# Patient Record
Sex: Male | Born: 1955
Health system: Southern US, Community
[De-identification: ages and names within clinical notes are randomized; demographics above are authoritative.]

## PROBLEM LIST (undated history)

## (undated) DIAGNOSIS — J449 Chronic obstructive pulmonary disease, unspecified: Secondary | ICD-10-CM

## (undated) DIAGNOSIS — M545 Low back pain: Secondary | ICD-10-CM

## (undated) DIAGNOSIS — F1010 Alcohol abuse, uncomplicated: Secondary | ICD-10-CM

## (undated) DIAGNOSIS — R0600 Dyspnea, unspecified: Secondary | ICD-10-CM

## (undated) DIAGNOSIS — M1990 Unspecified osteoarthritis, unspecified site: Secondary | ICD-10-CM

## (undated) DIAGNOSIS — IMO0001 Reserved for inherently not codable concepts without codable children: Secondary | ICD-10-CM

## (undated) DIAGNOSIS — Z87898 Personal history of other specified conditions: Secondary | ICD-10-CM

## (undated) DIAGNOSIS — G8929 Other chronic pain: Secondary | ICD-10-CM

## (undated) DIAGNOSIS — C801 Malignant (primary) neoplasm, unspecified: Secondary | ICD-10-CM

## (undated) DIAGNOSIS — F1491 Cocaine use, unspecified, in remission: Secondary | ICD-10-CM

## (undated) DIAGNOSIS — F17200 Nicotine dependence, unspecified, uncomplicated: Secondary | ICD-10-CM

## (undated) HISTORY — DX: Cocaine use, unspecified, in remission: F14.91

## (undated) HISTORY — DX: Low back pain: M54.5

## (undated) HISTORY — PX: ESOPHAGOGASTRODUODENOSCOPY ENDOSCOPY: SHX5814

## (undated) HISTORY — DX: Nicotine dependence, unspecified, uncomplicated: F17.200

## (undated) HISTORY — DX: Alcohol abuse, uncomplicated: F10.10

## (undated) HISTORY — DX: Other chronic pain: G89.29

## (undated) HISTORY — DX: Personal history of other specified conditions: Z87.898

## (undated) HISTORY — DX: Reserved for inherently not codable concepts without codable children: IMO0001

## (undated) HISTORY — PX: COLONOSCOPY: SHX174

---

## 1998-03-20 DIAGNOSIS — M545 Low back pain, unspecified: Secondary | ICD-10-CM

## 1998-03-20 HISTORY — DX: Low back pain, unspecified: M54.50

## 2002-09-11 ENCOUNTER — Emergency Department (HOSPITAL_COMMUNITY): Admission: EM | Admit: 2002-09-11 | Discharge: 2002-09-11 | Payer: Self-pay | Admitting: Emergency Medicine

## 2008-09-25 ENCOUNTER — Emergency Department (HOSPITAL_COMMUNITY): Admission: EM | Admit: 2008-09-25 | Discharge: 2008-09-25 | Payer: Self-pay | Admitting: Emergency Medicine

## 2008-09-30 ENCOUNTER — Emergency Department (HOSPITAL_COMMUNITY): Admission: EM | Admit: 2008-09-30 | Discharge: 2008-09-30 | Payer: Self-pay | Admitting: Emergency Medicine

## 2010-01-19 ENCOUNTER — Inpatient Hospital Stay (HOSPITAL_COMMUNITY): Admission: EM | Admit: 2010-01-19 | Discharge: 2010-01-22 | Payer: Self-pay | Admitting: Psychiatry

## 2010-01-19 ENCOUNTER — Ambulatory Visit: Payer: Self-pay | Admitting: Psychiatry

## 2010-01-19 ENCOUNTER — Emergency Department (HOSPITAL_COMMUNITY): Admission: EM | Admit: 2010-01-19 | Discharge: 2010-01-19 | Payer: Self-pay | Admitting: Emergency Medicine

## 2010-05-14 ENCOUNTER — Emergency Department (HOSPITAL_COMMUNITY): Payer: Managed Care, Other (non HMO)

## 2010-05-14 ENCOUNTER — Emergency Department (HOSPITAL_COMMUNITY)
Admission: EM | Admit: 2010-05-14 | Discharge: 2010-05-14 | Disposition: A | Discharge status: Home / Self Care | Payer: Managed Care, Other (non HMO) | Attending: Emergency Medicine | Admitting: Emergency Medicine

## 2010-05-14 DIAGNOSIS — M25559 Pain in unspecified hip: Secondary | ICD-10-CM | POA: Insufficient documentation

## 2010-05-14 DIAGNOSIS — M76899 Other specified enthesopathies of unspecified lower limb, excluding foot: Secondary | ICD-10-CM | POA: Insufficient documentation

## 2010-05-31 LAB — CBC
Platelets: 236 10*3/uL (ref 150–400)
RDW: 15.9 % — ABNORMAL HIGH (ref 11.5–15.5)
WBC: 5.6 10*3/uL (ref 4.0–10.5)

## 2010-05-31 LAB — TRICYCLICS SCREEN, URINE: TCA Scrn: NOT DETECTED

## 2010-05-31 LAB — DIFFERENTIAL
Basophils Absolute: 0 10*3/uL (ref 0.0–0.1)
Lymphocytes Relative: 38 % (ref 12–46)
Neutro Abs: 3 10*3/uL (ref 1.7–7.7)
Neutrophils Relative %: 54 % (ref 43–77)

## 2010-05-31 LAB — HEPATIC FUNCTION PANEL
ALT: 42 U/L (ref 0–53)
AST: 50 U/L — ABNORMAL HIGH (ref 0–37)
Total Protein: 7 g/dL (ref 6.0–8.3)

## 2010-05-31 LAB — ETHANOL: Alcohol, Ethyl (B): 186 mg/dL — ABNORMAL HIGH (ref 0–10)

## 2010-05-31 LAB — BASIC METABOLIC PANEL
BUN: 13 mg/dL (ref 6–23)
Calcium: 8.8 mg/dL (ref 8.4–10.5)
Creatinine, Ser: 0.76 mg/dL (ref 0.4–1.5)
GFR calc non Af Amer: >60 mL/min (ref >60)
Potassium: 3.7 mEq/L (ref 3.5–5.1)

## 2010-05-31 LAB — RAPID URINE DRUG SCREEN, HOSP PERFORMED: Benzodiazepines: NOT DETECTED

## 2010-10-16 ENCOUNTER — Inpatient Hospital Stay (INDEPENDENT_AMBULATORY_CARE_PROVIDER_SITE_OTHER)
Admission: RE | Admit: 2010-10-16 | Discharge: 2010-10-16 | Disposition: A | Discharge status: Home / Self Care | Payer: Managed Care, Other (non HMO) | Source: Ambulatory Visit | Attending: Family Medicine | Admitting: Family Medicine

## 2010-10-16 DIAGNOSIS — L259 Unspecified contact dermatitis, unspecified cause: Secondary | ICD-10-CM

## 2012-10-24 ENCOUNTER — Ambulatory Visit: Payer: Self-pay

## 2012-10-24 ENCOUNTER — Encounter: Payer: Self-pay | Admitting: Internal Medicine

## 2012-10-24 ENCOUNTER — Ambulatory Visit (INDEPENDENT_AMBULATORY_CARE_PROVIDER_SITE_OTHER): Payer: Self-pay | Admitting: Internal Medicine

## 2012-10-24 VITALS — BP 124/84 | HR 95 | Temp 97.0°F | Ht 74.0 in | Wt 157.9 lb

## 2012-10-24 DIAGNOSIS — F1994 Other psychoactive substance use, unspecified with psychoactive substance-induced mood disorder: Secondary | ICD-10-CM

## 2012-10-24 DIAGNOSIS — M549 Dorsalgia, unspecified: Secondary | ICD-10-CM

## 2012-10-24 DIAGNOSIS — F1010 Alcohol abuse, uncomplicated: Secondary | ICD-10-CM | POA: Insufficient documentation

## 2012-10-24 DIAGNOSIS — F1411 Cocaine abuse, in remission: Secondary | ICD-10-CM

## 2012-10-24 DIAGNOSIS — M160 Bilateral primary osteoarthritis of hip: Secondary | ICD-10-CM | POA: Insufficient documentation

## 2012-10-24 DIAGNOSIS — G8929 Other chronic pain: Secondary | ICD-10-CM

## 2012-10-24 DIAGNOSIS — F17200 Nicotine dependence, unspecified, uncomplicated: Secondary | ICD-10-CM

## 2012-10-24 DIAGNOSIS — Z0000 Encounter for general adult medical examination without abnormal findings: Secondary | ICD-10-CM | POA: Insufficient documentation

## 2012-10-24 DIAGNOSIS — Z720 Tobacco use: Secondary | ICD-10-CM | POA: Insufficient documentation

## 2012-10-24 LAB — COMPLETE METABOLIC PANEL WITH GFR
AST: 54 U/L — ABNORMAL HIGH (ref 0–37)
Alkaline Phosphatase: 90 U/L (ref 39–117)
GFR, Est Non African American: 73 mL/min
Glucose, Bld: 111 mg/dL — ABNORMAL HIGH (ref 70–99)
Potassium: 4.6 mEq/L (ref 3.5–5.3)
Total Bilirubin: 0.7 mg/dL (ref 0.3–1.2)
Total Protein: 7.9 g/dL (ref 6.0–8.3)

## 2012-10-24 LAB — LIPID PANEL
Cholesterol: 125 mg/dL (ref 0–200)
Total CHOL/HDL Ratio: 2.2 Ratio
Triglycerides: 162 mg/dL — ABNORMAL HIGH (ref <150)
VLDL: 32 mg/dL (ref 0–40)

## 2012-10-24 LAB — CBC WITH DIFFERENTIAL/PLATELET
Basophils Relative: 0 % (ref 0–1)
HCT: 38.3 % — ABNORMAL LOW (ref 39.0–52.0)
Hemoglobin: 12.6 g/dL — ABNORMAL LOW (ref 13.0–17.0)
Lymphocytes Relative: 34 % (ref 12–46)
MCHC: 32.9 g/dL (ref 30.0–36.0)
MCV: 78.8 fL (ref 78.0–100.0)
Monocytes Absolute: 0.6 10*3/uL (ref 0.1–1.0)
Monocytes Relative: 7 % (ref 3–12)
Neutro Abs: 5.7 10*3/uL (ref 1.7–7.7)

## 2012-10-24 NOTE — Patient Instructions (Addendum)
I'll order some lab works including cholesterol, liver function tests, PSA, and the CBC. I will contact to assist her with the results on Friday or Monday. Please stop smoking. Please stop alcohol. I will get records from Dr. Farris Has. Her get your colonoscopy results from Dr. Berneice Heinrich.    Treatment Goals:  Goals (1 Years of Data) as of 10/24/12         As of Today     Lifestyle    . Quit smoking / using tobacco  No      Progress Toward Treatment Goals:  Treatment Goal 10/24/2012  Stop smoking smoking the same amount    Self Care Goals & Plans:  Self Care Goal 10/24/2012  Manage my medications take my medicines as prescribed  Stop smoking call [facility] (1-800-QUIT-NOW)       Care Management & Community Referrals:          Smoking Cessation Quitting smoking is important to your health and has many advantages. However, it is not always easy to quit since nicotine is a very addictive drug. Often times, people try 3 times or more before being able to quit. This document explains the best ways for you to prepare to quit smoking. Quitting takes hard work and a lot of effort, but you can do it. ADVANTAGES OF QUITTING SMOKING  You will live longer, feel better, and live better.  Your body will feel the impact of quitting smoking almost immediately.  Within 20 minutes, blood pressure decreases. Your pulse returns to its normal level.  After 8 hours, carbon monoxide levels in the blood return to normal. Your oxygen level increases.  After 24 hours, the chance of having a heart attack starts to decrease. Your breath, hair, and body stop smelling like smoke.  After 48 hours, damaged nerve endings begin to recover. Your sense of taste and smell improve.  After 72 hours, the body is virtually free of nicotine. Your bronchial tubes relax and breathing becomes easier.  After 2 to 12 weeks, lungs can hold more air. Exercise becomes easier and circulation improves.  The risk of  having a heart attack, stroke, cancer, or lung disease is greatly reduced.  After 1 year, the risk of coronary heart disease is cut in half.  After 5 years, the risk of stroke falls to the same as a nonsmoker.  After 10 years, the risk of lung cancer is cut in half and the risk of other cancers decreases significantly.  After 15 years, the risk of coronary heart disease drops, usually to the level of a nonsmoker.  If you are pregnant, quitting smoking will improve your chances of having a healthy baby.  The people you live with, especially any children, will be healthier.  You will have extra money to spend on things other than cigarettes. QUESTIONS TO THINK ABOUT BEFORE ATTEMPTING TO QUIT You may want to talk about your answers with your caregiver.  Why do you want to quit?  If you tried to quit in the past, what helped and what did not?  What will be the most difficult situations for you after you quit? How will you plan to handle them?  Who can help you through the tough times? Your family? Friends? A caregiver?  What pleasures do you get from smoking? What ways can you still get pleasure if you quit? Here are some questions to ask your caregiver:  How can you help me to be successful at quitting?  What medicine do  you think would be best for me and how should I take it?  What should I do if I need more help?  What is smoking withdrawal like? How can I get information on withdrawal? GET READY  Set a quit date.  Change your environment by getting rid of all cigarettes, ashtrays, matches, and lighters in your home, car, or work. Do not let people smoke in your home.  Review your past attempts to quit. Think about what worked and what did not. GET SUPPORT AND ENCOURAGEMENT You have a better chance of being successful if you have help. You can get support in many ways.  Tell your family, friends, and co-workers that you are going to quit and need their support. Ask them  not to smoke around you.  Get individual, group, or telephone counseling and support. Programs are available at [employer] and health centers. Call your local health department for information about programs in your area.  Spiritual beliefs and practices may help some smokers quit.  Download a "quit meter" on your computer to keep track of quit statistics, such as how long you have gone without smoking, cigarettes not smoked, and money saved.  Get a self-help book about quitting smoking and staying off of tobacco. LEARN NEW SKILLS AND BEHAVIORS  Distract yourself from urges to smoke. Talk to someone, go for a walk, or occupy your time with a task.  Change your normal routine. Take a different route to work. Drink tea instead of coffee. Eat breakfast in a different place.  Reduce your stress. Take a hot bath, exercise, or read a book.  Plan something enjoyable to do every day. Reward yourself for not smoking.  Explore interactive web-based programs that specialize in helping you quit. GET MEDICINE AND USE IT CORRECTLY Medicines can help you stop smoking and decrease the urge to smoke. Combining medicine with the above behavioral methods and support can greatly increase your chances of successfully quitting smoking.  Nicotine replacement therapy helps deliver nicotine to your body without the negative effects and risks of smoking. Nicotine replacement therapy includes nicotine gum, lozenges, inhalers, nasal sprays, and skin patches. Some may be available over-the-counter and others require a prescription.  Antidepressant medicine helps people abstain from smoking, but how this works is unknown. This medicine is available by prescription.  Nicotinic receptor partial agonist medicine simulates the effect of nicotine in your brain. This medicine is available by prescription. Ask your caregiver for advice about which medicines to use and how to use them based on your health history. Your  caregiver will tell you what side effects to look out for if you choose to be on a medicine or therapy. Carefully read the information on the package. Do not use any other product containing nicotine while using a nicotine replacement product.  RELAPSE OR DIFFICULT SITUATIONS Most relapses occur within the first 3 months after quitting. Do not be discouraged if you start smoking again. Remember, most people try several times before finally quitting. You may have symptoms of withdrawal because your body is used to nicotine. You may crave cigarettes, be irritable, feel very hungry, cough often, get headaches, or have difficulty concentrating. The withdrawal symptoms are only temporary. They are strongest when you first quit, but they will go away within 10 14 days. To reduce the chances of relapse, try to:  Avoid drinking alcohol. Drinking lowers your chances of successfully quitting.  Reduce the amount of caffeine you consume. Once you quit smoking, the amount  of caffeine in your body increases and can give you symptoms, such as a rapid heartbeat, sweating, and anxiety.  Avoid smokers because they can make you want to smoke.  Do not let weight gain distract you. Many smokers will gain weight when they quit, usually less than 10 pounds. Eat a healthy diet and stay active. You can always lose the weight gained after you quit.  Find ways to improve your mood other than smoking. FOR MORE INFORMATION  www.smokefree.gov  Document Released: 02/28/2001 Document Revised: 09/05/2011 Document Reviewed: 06/15/2011 [facility] Patient Information 2014 [place], [place]. Please followup with me in 2 weeks.    Alcohol Problems Most adults who drink alcohol drink in moderation (not a lot) are at low risk for developing problems related to their drinking. However, all drinkers, including low-risk drinkers, should know about the health risks connected with drinking alcohol. RECOMMENDATIONS FOR LOW-RISK DRINKING   Drink in moderation. Moderate drinking is defined as follows:   Men - no more than 2 drinks per day.  Nonpregnant women - no more than 1 drink per day.  Over age 97 - no more than 1 drink per day. A standard drink is 12 grams of pure alcohol, which is equal to a 12 ounce bottle of beer or wine cooler, a 5 ounce glass of wine, or 1.5 ounces of distilled spirits (such as whiskey, brandy, vodka, or rum).  ABSTAIN FROM (DO NOT DRINK) ALCOHOL:  When pregnant or considering pregnancy.  When taking a medication that interacts with alcohol.  If you are alcohol dependent.  A medical condition that prohibits drinking alcohol (such as ulcer, liver disease, or heart disease). DISCUSS WITH YOUR CAREGIVER:  If you are at risk for coronary heart disease, discuss the potential benefits and risks of alcohol use: Light to moderate drinking is associated with lower rates of coronary heart disease in certain populations (for example, men over age 34 and postmenopausal women). Infrequent or nondrinkers are advised not to begin light to moderate drinking to reduce the risk of coronary heart disease so as to avoid creating an alcohol-related problem. Similar protective effects can likely be gained through proper diet and exercise.  Women and the elderly have smaller amounts of body water than men. As a result women and the elderly achieve a higher blood alcohol concentration after drinking the same amount of alcohol.  Exposing a fetus to alcohol can cause a broad range of birth defects referred to as Fetal Alcohol Syndrome (FAS) or Alcohol-Related Birth Defects (ARBD). Although FAS/ARBD is connected with excessive alcohol consumption during pregnancy, studies also have reported neurobehavioral problems in infants born to mothers reporting drinking an average of 1 drink per day during pregnancy.  Heavier drinking (the consumption of more than 4 drinks per occasion by men and more than 3 drinks per occasion by  women) impairs learning (cognitive) and psychomotor functions and increases the risk of alcohol-related problems, including accidents and injuries. CAGE QUESTIONS:   Have you ever felt that you should Cut down on your drinking?  Have people Annoyed you by criticizing your drinking?  Have you ever felt bad or Guilty about your drinking?  Have you ever had a drink first thing in the morning to steady your nerves or get rid of a hangover (Eye opener)? If you answered positively to any of these questions: You may be at risk for alcohol-related problems if alcohol consumption is:   Men: Greater than 14 drinks per week or more than 4 drinks per occasion.  Women: Greater than 7 drinks per week or more than 3 drinks per occasion. Do you or your family have a medical history of alcohol-related problems, such as:  Blackouts.  Sexual dysfunction.  Depression.  Trauma.  Liver dysfunction.  Sleep disorders.  Hypertension.  Chronic abdominal pain.  Has your drinking ever caused you problems, such as problems with your family, problems with your work (or school) performance, or accidents/injuries?  Do you have a compulsion to drink or a preoccupation with drinking?  Do you have poor control or are you unable to stop drinking once you have started?  Do you have to drink to avoid withdrawal symptoms?  Do you have problems with withdrawal such as tremors, nausea, sweats, or mood disturbances?  Does it take more alcohol than in the past to get you high?  Do you feel a strong urge to drink?  Do you change your plans so that you can have a drink?  Do you ever drink in the morning to relieve the shakes or a hangover? If you have answered a number of the previous questions positively, it may be time for you to talk to your caregivers, family, and friends and see if they think you have a problem. Alcoholism is a chemical dependency that keeps getting worse and will eventually destroy your  health and relationships. Many alcoholics end up dead, impoverished, or in prison. This is often the end result of all chemical dependency.  Do not be discouraged if you are not ready to take action immediately.  Decisions to change behavior often involve up and down desires to change and feeling like you cannot decide.  Try to think more seriously about your drinking behavior.  Think of the reasons to quit. WHERE TO GO FOR ADDITIONAL INFORMATION   The National Institute on Alcohol Abuse and Alcoholism (NIAAA) BasicStudents.dk  [employer] on Alcoholism and Drug Dependence (NCADD) www.ncadd.org  American Society of Addiction Medicine (ASAM) RoyalDiary.gl  Document Released: 03/06/2005 Document Revised: 05/29/2011 Document Reviewed: 10/23/2007 [facility] Patient Information 2014 [place], [place].

## 2012-10-24 NOTE — Assessment & Plan Note (Signed)
  Assessment: Progress toward smoking cessation:  smoking the same amount Barriers to progress toward smoking cessation:  lack of understanding of harms of tobacco Comments:   Plan: Instruction/counseling given:  I counseled patient on the dangers of tobacco use, advised patient to stop smoking, and reviewed strategies to maximize success. Educational resources provided:    Self management tools provided:  smoking cessation plan (STAR Quit Plan) Medications to assist with smoking cessation:  None Patient agreed to the following self-care plans for smoking cessation: call [facility] (1-800-QUIT-NOW)  Other plans: will continue to address this on next visit.

## 2012-10-24 NOTE — Assessment & Plan Note (Signed)
Counseled the patient about alcohol cessation. Ordered a CBC and CMP.

## 2012-10-24 NOTE — Assessment & Plan Note (Signed)
I counseled the patient about avoidance of cocaine abuse.

## 2012-10-24 NOTE — Progress Notes (Signed)
Patient ID: Robert Bray, male   DOB: [birth year], 57 y.o.   MRN: [id number]          HPI: [name] is a 57 y.o. with past medical history of chronic back pain, polysubstance abuse including cocaine, marijuana, and alcohol presents to the clinic to establish care.   The patient comes in with his sister who is also his healthcare power of attorney. The main concern is the ongoing chronic low back pain.   Patient reports that he has been disabled over the last 3 years from chronic back pain, which he attributes to his previous job that involved lifting heavy objects. He was evaluated by orthopedic surgery some time ago and he reports that he used get steroid injections in his hip joints until he ran out of his insurance. He uses cane for last one year.  He describes his pain as constant, about 9/10, shooting down his legs, it improves to about 4/10 with Tylenol. He denies numbness, tingling, or weakness in his legs. He is unsure about the dose of Tylenol that he uses. He reports that he takes 2 pills about twice in a week. The patient is unable to enjoy activities like fishing and also expresses pain when he sits for a long time to watch TV.   The patient lives alone after the death of his wife about 5 years ago. He admits to a history of alcohol abuse, whereby he takes two 40 ounces cans of beers twice a week. However, he denies recent cocaine, marijuana, and other drugs of abuse. Of note, patient has a UDS that was positive for cocaine in 2011. During that time, the patient had an episode of a psych admission with suicide ideations without a plan. He spent 5 days in the hospital.   His sister, who comes with him to the clinic is very motivated to help. The patient. Followup with his clinic appointments and keeping good health.    Past Medical History  Diagnosis Date  . Chronic low back pain 2000    Reports that he has been treated by orthopedic surgeon, but was unable to  continue to to lack of insurance. He has had steroid injections in the hip. He attributes his back pain to his job, which involved lifting heavy objects.  . History of cocaine use     UDS was positive for cocaine in 2011.  Marland Kitchen Smoking   . Alcohol abuse    Current Outpatient Prescriptions  Medication Sig Dispense Refill  . acetaminophen (TYLENOL) 325 MG tablet Take 650 mg by mouth every 6 (six) hours as needed for pain.       No current facility-administered medications for this visit.   Family History  Problem Relation Age of Onset  . Hypertension Mother   . Hypertension Sister   . Kidney failure Mother     transplant in adult age. Unclear cause   . Breast cancer Sister   . Brain cancer Brother   . Breast cancer Sister     In remission    History   Social History  . Marital Status: Widowed    Spouse Name: N/A    Number of Children: N/A  . Years of Education: N/A   Social History Main Topics  . Smoking status: Current Every Day Smoker -- 0.50 packs/day for 15 years  . Smokeless tobacco: None  . Alcohol Use: Yes     Comment: Beer sometimes.  . Drug Use: None  . Sexually  Active: None   Other Topics Concern  . None   Social History Narrative  . None    Review of Systems: Constitutional: Denies fever, chills, diaphoresis, appetite change and fatigue.  Respiratory: Denies SOB, DOE, cough, chest tightness, and wheezing.  Cardiovascular: No chest pain, palpitations and leg swelling.  Gastrointestinal: No abdominal pain, nausea, vomiting, bloody stools Genitourinary: No dysuria, frequency, hematuria, or flank pain.   Objective:  Physical Exam: Filed Vitals:   10/24/12 0906  BP: 124/84  Pulse: 95  Temp: 97 F (36.1 C)  TempSrc: Oral  Height: 6\' 2"  (1.88 m)  Weight: 157 lb 14.4 oz (71.623 kg)  SpO2: 97%   General: Well nourished. No acute distress. His sister in exam room Lungs: CTA bilaterally. Heart: RRR; no extra sounds or murmurs  Abdomen: Non-distended,  normal BS, soft, nontender; no hepatosplenomegaly Rectal exam: Normal, without prostate enlargement. Musculoskeletal: Patient has some difficulties getting to the couch. Internal and external rotation of both hip joints elicit mild tenderness. Straight leg raising sign is negative on both legs.  Examination of his back is benign.  Extremities: No pedal edema. Good strength bilaterally. Neurologic: Alert and oriented x3. No obvious neurologic deficits.  Assessment & Plan:  I have discussed my assessment and plan  with Dr. Meredith Pel  as detailed under problem based charting.

## 2012-10-24 NOTE — Assessment & Plan Note (Signed)
The etiology of this chronic back pain is unclear, but could very well be related to his previous job. Fortunately, is getting good relief from Tylenol. I have encouraged him to continue using Tylenol as needed for pain. He will bring his pills on the next visit. I will get records from his orthopedic office. I chose to defer imaging until I review medical record. I will see him 2 weeks from now.

## 2012-10-24 NOTE — Assessment & Plan Note (Addendum)
The patient's sister was very concerned about his screening and health maintenance. Reportedly, he had a normal colonoscopy 3 years ago by Dr. Berneice Heinrich. Will get report for this. I have ordered a PSA, even though his digital rectal exam was normal. I have also ordered a lipid panel

## 2012-10-25 ENCOUNTER — Telehealth: Payer: Self-pay | Admitting: Internal Medicine

## 2012-10-25 NOTE — Progress Notes (Signed)
Case discussed with Dr. Kazibwe soon after the resident saw the patient.  We reviewed the resident's history and exam and pertinent patient test results.  I agree with the assessment, diagnosis, and plan of care documented in the resident's note. 

## 2012-10-25 NOTE — Telephone Encounter (Signed)
I called Mr. Lundberg' sister and informed her about the results of the CMP, CBC, PSA, and lipid panel.

## 2012-11-07 ENCOUNTER — Encounter: Payer: Self-pay | Admitting: Internal Medicine

## 2012-11-07 ENCOUNTER — Ambulatory Visit (INDEPENDENT_AMBULATORY_CARE_PROVIDER_SITE_OTHER): Payer: No Typology Code available for payment source | Admitting: Internal Medicine

## 2012-11-07 VITALS — BP 124/82 | HR 84 | Temp 96.5°F | Ht 74.0 in | Wt 160.2 lb

## 2012-11-07 DIAGNOSIS — Z0000 Encounter for general adult medical examination without abnormal findings: Secondary | ICD-10-CM

## 2012-11-07 DIAGNOSIS — M549 Dorsalgia, unspecified: Secondary | ICD-10-CM

## 2012-11-07 DIAGNOSIS — G8929 Other chronic pain: Secondary | ICD-10-CM

## 2012-11-07 DIAGNOSIS — M25559 Pain in unspecified hip: Secondary | ICD-10-CM

## 2012-11-07 NOTE — Assessment & Plan Note (Signed)
Discussed with the patient that we will consider doing a hip xray but we would prefer to have his records from the orthopedics first. His pain is currently controlled with Advil.  Plan  - obtain records from Dr Farris Has (ortho) - Consider imaging with xray followed by Ct scan as there is a possibility of AVN - to continue with Advil for now  - to follow up in one month

## 2012-11-07 NOTE — Progress Notes (Signed)
Subjective:   HPI: [name] is a 57 y.o. gentleman with past medical history of chronic low back pain, and right hip pain, history of cocaine abuse, cigarette smoking, and alcohol abuse, presents to the clinic for followup visit.  Patient continues to report his right hip pain and chronic back pain have continued to bother him. However, report some relief with Advil 2 pills when necessary. He is able to do activities like mowing the grass and fishing after taking 2 pills of Advil. We have been unable to obtain her records from his previous orthopedic surgeon. He denies interim symptoms since last visit 2 weeks.   Kindly see the A&P for the status of the pt's chronic medical problems.   Past Medical History  Diagnosis Date  . Chronic low back pain 2000    Reports that he has been treated by orthopedic surgeon, but was unable to continue to to lack of insurance. He has had steroid injections in the hip. He attributes his back pain to his job, which involved lifting heavy objects.  . History of cocaine use     UDS was positive for cocaine in 2011.  Marland Kitchen Smoking   . Alcohol abuse    Current Outpatient Prescriptions  Medication Sig Dispense Refill  . naproxen sodium (ANAPROX) 220 MG tablet Take 220 mg by mouth 2 (two) times daily with a meal.       No current facility-administered medications for this visit.   Family History  Problem Relation Age of Onset  . Hypertension Mother   . Hypertension Sister   . Kidney failure Mother     transplant in adult age. Unclear cause   . Breast cancer Sister   . Brain cancer Brother   . Breast cancer Sister     In remission    History   Social History  . Marital Status: Widowed    Spouse Name: N/A    Number of Children: N/A  . Years of Education: N/A   Social History Main Topics  . Smoking status: Current Every Day Smoker -- 0.30 packs/day for 15 years  . Smokeless tobacco: None  . Alcohol Use: Yes     Comment: Beer  sometimes.  . Drug Use: None  . Sexual Activity: None   Other Topics Concern  . None   Social History Narrative  . None   Review of Systems: Constitutional: Denies fever, chills, diaphoresis, appetite change and fatigue.  Respiratory: Denies SOB, DOE, cough, chest tightness, and wheezing.  Cardiovascular: No chest pain, palpitations and leg swelling.  Gastrointestinal: No abdominal pain, nausea, vomiting, bloody stools Genitourinary: No dysuria, frequency, hematuria, or flank pain.   Objective:  Physical Exam: Filed Vitals:   11/07/12 0950  BP: 124/82  Pulse: 84  Temp: 96.5 F (35.8 C)  TempSrc: Oral  Height: 6\' 2"  (1.88 m)  Weight: 160 lb 3.2 oz (72.666 kg)  SpO2: 97%   General: Well nourished. No acute distress. His sister in exam room  Lungs: CTA bilaterally.  Heart: RRR; no extra sounds or murmurs  Abdomen: Non-distended, normal BS, soft, nontender; no hepatosplenomegaly  Rectal exam: Normal, without prostate enlargement.  Musculoskeletal: Patient has some difficulties getting to the couch. Internal and external rotation of both hip joints elicit mild tenderness. Straight leg raising sign is negative on both legs.  Examination of his back is benign.  Extremities: No pedal edema. Good strength bilaterally.  Neurologic: Alert and oriented x3. No obvious neurologic deficits.  Assessment &  Plan:  I have discussed my assessment and plan  with Dr. Josem Kaufmann as detailed under problem based charting.

## 2012-11-07 NOTE — Patient Instructions (Addendum)
General Instructions: We will continue to try get records from Dr Remigio Eisenmenger  Please be sure to get your orange card application  Please follow up within one month Please stop alcohol and cigarette smoking    Treatment Goals:  Goals (1 Years of Data) as of 11/07/12         10/24/12     Lifestyle    . Quit smoking / using tobacco  No      Progress Toward Treatment Goals:  Treatment Goal 11/07/2012  Stop smoking smoking the same amount    Self Care Goals & Plans:  Self Care Goal 11/07/2012  Manage my medications take my medicines as prescribed  Stop smoking call [facility] (1-800-QUIT-NOW)       Care Management & Community Referrals:

## 2012-11-07 NOTE — Assessment & Plan Note (Signed)
Pain controlled with Advil. He will follow up in one month. We will get medical records from ortho

## 2012-11-10 NOTE — Progress Notes (Signed)
Case discussed with Dr. Zada Girt immediately after the visit. We reviewed the resident's history and exam and pertinent patient test results. I agree with the assessment, diagnosis and plan of care documented in the resident's note.  AVN remains in the differential diagnosis and if the hip film is unremarkable it must be considered.  Further evaluation could include a CT scan of the hip to further assess.

## 2012-12-01 ENCOUNTER — Encounter: Payer: Self-pay | Admitting: Internal Medicine

## 2012-12-23 ENCOUNTER — Ambulatory Visit (INDEPENDENT_AMBULATORY_CARE_PROVIDER_SITE_OTHER): Payer: No Typology Code available for payment source | Admitting: Internal Medicine

## 2012-12-23 ENCOUNTER — Encounter: Payer: Self-pay | Admitting: Internal Medicine

## 2012-12-23 VITALS — BP 115/68 | HR 91 | Temp 98.5°F | Ht 74.0 in | Wt 155.5 lb

## 2012-12-23 DIAGNOSIS — G8929 Other chronic pain: Secondary | ICD-10-CM

## 2012-12-23 DIAGNOSIS — Z720 Tobacco use: Secondary | ICD-10-CM

## 2012-12-23 DIAGNOSIS — M549 Dorsalgia, unspecified: Secondary | ICD-10-CM

## 2012-12-23 DIAGNOSIS — F17200 Nicotine dependence, unspecified, uncomplicated: Secondary | ICD-10-CM

## 2012-12-23 DIAGNOSIS — M25559 Pain in unspecified hip: Secondary | ICD-10-CM

## 2012-12-23 LAB — BASIC METABOLIC PANEL WITH GFR
BUN: 12 mg/dL (ref 6–23)
Calcium: 9.8 mg/dL (ref 8.4–10.5)
Creat: 1.3 mg/dL (ref 0.50–1.35)
GFR, Est African American: 70 mL/min
GFR, Est Non African American: 61 mL/min

## 2012-12-23 MED ORDER — NAPROXEN 500 MG PO TABS: 500.0000 mg | ORAL_TABLET | Freq: Two times a day (BID) | ORAL | Status: DC

## 2012-12-23 MED ORDER — GABAPENTIN 300 MG PO CAPS: 300.0000 mg | ORAL_CAPSULE | Freq: Three times a day (TID) | ORAL | Status: DC

## 2012-12-23 NOTE — Assessment & Plan Note (Signed)
As noted in overview, he has extensive imaging and various treatments of his hips and back in from Dr Blenda Bridegroom office between 2012 to 2013. There is a possible component of neuropathic pain as well. Family inquires about benefit of gabapentin. He might benefit from this medication.  Plan  - Gabapentin up to 300 mg tid. If cost is prohibitive, we can try Amitriptylline  - A referral to physical therapy -Continue with naproxen 500 mg twice a day when necessary - follow up in one month at which time we can consider referral to pain clinic

## 2012-12-23 NOTE — Assessment & Plan Note (Signed)
Pt not ready to quit. I assured him that we can help him when he is ready.

## 2012-12-23 NOTE — Progress Notes (Signed)
Subjective:   HPI: [name] is a 57 y.o. gentleman with past medical history of chronic low back pain, and right hip pain, history of cocaine abuse, cigarette smoking, and alcohol abuse, presents to the clinic for followup visit. I last saw him on 11/07/2012.  Patient continues to report his right hip pain and chronic back pain have continued to bother him and continues to use Advil 2 pills when necessary.  Sometimes the pain wakes him up from sleep, but again, he reports relief with Advil. The pain tends to increased on cold days.   He also reports to me that over the last 1 week, he has developed muscle cramps involving his legs. This is the first time he has experienced these symptoms. He attributes to his muscle cramps to the cold weather. He has not tried any treatment for these muscle cramps. His labs in 10/2012 including K were largely unremarkable. No new recent medications. However, the patient also reports burning sensations on the lateral side of his thighs bilaterally, without tingling, or numbness. He has significant difficulties walking. He required a wheelchair from the parking lot to the clinic.  Kindly see the A&P for the status of the pt's chronic medical problems.   Past Medical History  Diagnosis Date  . Chronic low back pain 2000    Reports that he has been treated by orthopedic surgeon, but was unable to continue to to lack of insurance. He has had steroid injections in the hip. He attributes his back pain to his job, which involved lifting heavy objects.  . History of cocaine use     UDS was positive for cocaine in 2011.  Marland Kitchen Smoking   . Alcohol abuse    Current Outpatient Prescriptions  Medication Sig Dispense Refill  . gabapentin (NEURONTIN) 300 MG capsule Take 1 capsule (300 mg total) by mouth 3 (three) times daily.  90 capsule  2  . naproxen (NAPROSYN) 500 MG tablet Take 1 tablet (500 mg total) by mouth 2 (two) times daily with a meal.  60 tablet  2    No current facility-administered medications for this visit.   Family History  Problem Relation Age of Onset  . Hypertension Mother   . Hypertension Sister   . Kidney failure Mother     transplant in adult age. Unclear cause   . Breast cancer Sister   . Brain cancer Brother   . Breast cancer Sister     In remission    History   Social History  . Marital Status: Widowed    Spouse Name: N/A    Number of Children: N/A  . Years of Education: N/A   Social History Main Topics  . Smoking status: Current Every Day Smoker -- 0.30 packs/day for 15 years  . Smokeless tobacco: None  . Alcohol Use: Yes     Comment: Beer sometimes.  . Drug Use: None  . Sexual Activity: None   Other Topics Concern  . None   Social History Narrative  . None   Review of Systems: Constitutional: Denies fever, chills, diaphoresis, appetite change and fatigue.  Respiratory: Denies SOB, DOE, cough, chest tightness, and wheezing.  Cardiovascular: No chest pain, palpitations and leg swelling.  Gastrointestinal: No abdominal pain, nausea, vomiting, bloody stools Genitourinary: No dysuria, frequency, hematuria, or flank pain.   Objective:  Physical Exam: Filed Vitals:   12/23/12 1322  BP: 115/68  Pulse: 91  Temp: 98.5 F (36.9 C)  TempSrc: Oral  Height:  6\' 2"  (1.88 m)  Weight: 155 lb 8 oz (70.534 kg)  SpO2: 98%   General: Well nourished. No acute distress. His sister in exam room. antalgic gait.  Lungs: CTA bilaterally.  Heart: RRR; no extra sounds or murmurs  Abdomen: Non-distended, normal BS, soft, nontender; no hepatosplenomegaly  Rectal exam: Normal, without prostate enlargement.  Musculoskeletal: Patient has some difficulties getting to the couch. Internal and external rotation of both hip joints elicit mild tenderness. Straight leg raising sign is negative on both legs. Examination of reveals tenderness on deep palpation of the lumbar region. No increased tensity of the paraspinal  muscles.  Extremities: No pedal edema. Good strength bilaterally.  Neurologic: Alert and oriented x3. No obvious neurologic deficits.  Assessment & Plan:  I have discussed my assessment and plan with Dr. Josem Bray as detailed under problem based charting.

## 2012-12-23 NOTE — Assessment & Plan Note (Addendum)
See note under chronic back pain. Due to his described cramps, I will order BMP and Mg level.

## 2012-12-23 NOTE — Patient Instructions (Signed)
Please start gabapentin at with 300 mg daily for one day, then 300 mg twice daily for one day then 300 mg 3 times daily. If cost is prohibitive, we can try Amitriptyline  I will send you over to physical therapy  I will order some labs today  Please continue to take Naproxen 500 mg twice daily as needed for pain  I will see you again in 1 month

## 2012-12-23 NOTE — Progress Notes (Signed)
Case discussed with Dr. Kazibwe at the time of the visit.  We reviewed the resident's history and exam and pertinent patient test results.  I agree with the assessment, diagnosis and plan of care documented in the resident's note. 

## 2013-01-15 ENCOUNTER — Ambulatory Visit: Payer: No Typology Code available for payment source | Attending: Internal Medicine

## 2013-01-15 DIAGNOSIS — M1610 Unilateral primary osteoarthritis, unspecified hip: Secondary | ICD-10-CM | POA: Insufficient documentation

## 2013-01-15 DIAGNOSIS — M2560 Stiffness of unspecified joint, not elsewhere classified: Secondary | ICD-10-CM | POA: Insufficient documentation

## 2013-01-15 DIAGNOSIS — R293 Abnormal posture: Secondary | ICD-10-CM | POA: Insufficient documentation

## 2013-01-15 DIAGNOSIS — IMO0001 Reserved for inherently not codable concepts without codable children: Secondary | ICD-10-CM | POA: Insufficient documentation

## 2013-01-15 DIAGNOSIS — M169 Osteoarthritis of hip, unspecified: Secondary | ICD-10-CM | POA: Insufficient documentation

## 2013-01-15 DIAGNOSIS — M2550 Pain in unspecified joint: Secondary | ICD-10-CM | POA: Insufficient documentation

## 2013-01-15 DIAGNOSIS — R262 Difficulty in walking, not elsewhere classified: Secondary | ICD-10-CM | POA: Insufficient documentation

## 2013-01-20 ENCOUNTER — Ambulatory Visit: Payer: No Typology Code available for payment source | Attending: Internal Medicine

## 2013-01-20 DIAGNOSIS — M169 Osteoarthritis of hip, unspecified: Secondary | ICD-10-CM | POA: Insufficient documentation

## 2013-01-20 DIAGNOSIS — IMO0001 Reserved for inherently not codable concepts without codable children: Secondary | ICD-10-CM | POA: Insufficient documentation

## 2013-01-20 DIAGNOSIS — R293 Abnormal posture: Secondary | ICD-10-CM | POA: Insufficient documentation

## 2013-01-20 DIAGNOSIS — M1610 Unilateral primary osteoarthritis, unspecified hip: Secondary | ICD-10-CM | POA: Insufficient documentation

## 2013-01-20 DIAGNOSIS — M2560 Stiffness of unspecified joint, not elsewhere classified: Secondary | ICD-10-CM | POA: Insufficient documentation

## 2013-01-20 DIAGNOSIS — M2550 Pain in unspecified joint: Secondary | ICD-10-CM | POA: Insufficient documentation

## 2013-01-20 DIAGNOSIS — R262 Difficulty in walking, not elsewhere classified: Secondary | ICD-10-CM | POA: Insufficient documentation

## 2013-01-22 ENCOUNTER — Encounter: Payer: No Typology Code available for payment source | Admitting: Physical Therapy

## 2013-01-22 ENCOUNTER — Ambulatory Visit (INDEPENDENT_AMBULATORY_CARE_PROVIDER_SITE_OTHER): Payer: No Typology Code available for payment source | Admitting: Internal Medicine

## 2013-01-22 ENCOUNTER — Encounter: Payer: Self-pay | Admitting: Internal Medicine

## 2013-01-22 VITALS — BP 114/70 | HR 112 | Temp 97.4°F | Ht 73.0 in | Wt 155.3 lb

## 2013-01-22 DIAGNOSIS — M25559 Pain in unspecified hip: Secondary | ICD-10-CM

## 2013-01-22 DIAGNOSIS — M549 Dorsalgia, unspecified: Secondary | ICD-10-CM

## 2013-01-22 DIAGNOSIS — Z0000 Encounter for general adult medical examination without abnormal findings: Secondary | ICD-10-CM

## 2013-01-22 DIAGNOSIS — Z720 Tobacco use: Secondary | ICD-10-CM

## 2013-01-22 DIAGNOSIS — F17200 Nicotine dependence, unspecified, uncomplicated: Secondary | ICD-10-CM

## 2013-01-22 DIAGNOSIS — G8929 Other chronic pain: Secondary | ICD-10-CM

## 2013-01-22 DIAGNOSIS — Z23 Encounter for immunization: Secondary | ICD-10-CM

## 2013-01-22 DIAGNOSIS — F1010 Alcohol abuse, uncomplicated: Secondary | ICD-10-CM

## 2013-01-22 MED ORDER — NICOTINE 14 MG/24HR TD PT24: 14.0000 mg | MEDICATED_PATCH | TRANSDERMAL | Status: DC

## 2013-01-22 NOTE — Assessment & Plan Note (Signed)
Chronic low back pain, responding examined for outpatient PT. Encouraged patient to continue with PT and use Tylenol PM one pill as needed at bedtime.

## 2013-01-22 NOTE — Progress Notes (Signed)
Subjective:   HPI: Robert Bray is a 57 y.o. gentleman with past medical history of chronic low back pain, and right hip pain, history of cocaine abuse, cigarette smoking, and alcohol abuse, presents to the clinic for followup visit.  No new complaints today. Last office visit was 12/23/2012 where I referred him to outpatient PT. The response from therapy has been excellent. His low back pain and leg pains have significantly improved. He attends outpatient PT twice a week. He was unable to get Neurontin due to cost. He has been using Tylenol PM when necessary at bedtime. He only needs it a few times a week.  Kindly see the A&P for the status of the pt's chronic medical problems.   Past Medical History  Diagnosis Date  . Chronic low back pain 2000    Reports that he has been treated by orthopedic surgeon, but was unable to continue to to lack of insurance. He has had steroid injections in the hip. He attributes his back pain to his job, which involved lifting heavy objects.  . History of cocaine use     UDS was positive for cocaine in 2011.  Marland Kitchen Smoking   . Alcohol abuse    Current Outpatient Prescriptions  Medication Sig Dispense Refill  . diphenhydramine-acetaminophen (TYLENOL PM) 25-500 MG TABS Take 1-2 tablets by mouth at bedtime as needed.      . nicotine (NICODERM CQ - DOSED IN MG/24 HOURS) 14 mg/24hr patch Place 1 patch (14 mg total) onto the skin daily.  30 patch  0   No current facility-administered medications for this visit.   Family History  Problem Relation Age of Onset  . Hypertension Mother   . Hypertension Sister   . Kidney failure Mother     transplant in adult age. Unclear cause   . Breast cancer Sister   . Brain cancer Brother   . Breast cancer Sister     In remission    History   Social History  . Marital Status: Widowed    Spouse Name: N/A    Number of Children: N/A  . Years of Education: N/A   Social History Main Topics  . Smoking status:  Current Every Day Smoker -- 0.30 packs/day for 15 years  . Smokeless tobacco: None  . Alcohol Use: Yes     Comment: Beer sometimes.  . Drug Use: None  . Sexual Activity: None   Other Topics Concern  . None   Social History Narrative  . None   Review of Systems: Constitutional: Denies fever, chills, diaphoresis, appetite change and fatigue.  Respiratory: Denies SOB, DOE, cough, chest tightness, and wheezing.  Cardiovascular: No chest pain, palpitations and leg swelling.  Gastrointestinal: No abdominal pain, nausea, vomiting, bloody stools Genitourinary: No dysuria, frequency, hematuria, or flank pain.   Objective:  Physical Exam: Filed Vitals:   01/22/13 1025  BP: 114/70  Pulse: 112  Temp: 97.4 F (36.3 C)  TempSrc: Oral  Height: 6\' 1"  (1.854 m)  Weight: 155 lb 4.8 oz (70.444 kg)  SpO2: 99%   General: Well nourished. Appears comfortable. No acute distress. His sister in exam room. antalgic gait.  Lungs: CTA bilaterally.  Heart: RRR; no extra sounds or murmurs  Abdomen: Non-distended, normal BS, soft, nontender; no hepatosplenomegaly  Rectal exam: Normal, without prostate enlargement.  Musculoskeletal: Internal and external rotation of both hip joints elicit no tenderness. Straight leg raising sign is negative on both legs.  No increased tensity of the paraspinal  muscles.  Extremities: No pedal edema. Good strength bilaterally.  Neurologic: Alert and oriented x3. No obvious neurologic deficits.  Assessment & Plan:  I have discussed my assessment and plan with Dr. Dalphine Handing as detailed under problem based charting.

## 2013-01-22 NOTE — Assessment & Plan Note (Signed)
Patient wishes to stop smoking. He is interested in trying nicotine patches. Will prescribe.

## 2013-01-22 NOTE — Assessment & Plan Note (Signed)
Pain responding to PT. Continue with PT

## 2013-01-22 NOTE — Patient Instructions (Signed)
Please continue with physical therapy  Please continue with Tylenol PM for pain Please follow up in 3 month.

## 2013-01-23 ENCOUNTER — Ambulatory Visit: Payer: No Typology Code available for payment source | Admitting: Rehabilitation

## 2013-01-23 NOTE — Progress Notes (Signed)
Case discussed with Dr.Kazibwe at the time of the visit.  We reviewed the resident's history and exam and pertinent patient test results.  I agree with the assessment, diagnosis, and plan of care documented in the resident's note.    

## 2013-01-27 ENCOUNTER — Ambulatory Visit: Payer: No Typology Code available for payment source

## 2013-01-28 ENCOUNTER — Ambulatory Visit: Payer: No Typology Code available for payment source | Admitting: Physical Therapy

## 2013-01-29 ENCOUNTER — Encounter: Payer: No Typology Code available for payment source | Admitting: Internal Medicine

## 2013-01-29 ENCOUNTER — Ambulatory Visit: Payer: No Typology Code available for payment source | Admitting: Rehabilitation

## 2013-02-04 ENCOUNTER — Ambulatory Visit: Payer: No Typology Code available for payment source | Admitting: Rehabilitation

## 2013-02-05 ENCOUNTER — Ambulatory Visit: Payer: No Typology Code available for payment source | Admitting: Physical Therapy

## 2013-02-11 ENCOUNTER — Ambulatory Visit: Payer: No Typology Code available for payment source | Admitting: Rehabilitation

## 2013-02-11 ENCOUNTER — Ambulatory Visit: Payer: No Typology Code available for payment source

## 2013-02-18 ENCOUNTER — Ambulatory Visit: Payer: No Typology Code available for payment source | Attending: Internal Medicine

## 2013-02-18 DIAGNOSIS — M1610 Unilateral primary osteoarthritis, unspecified hip: Secondary | ICD-10-CM | POA: Insufficient documentation

## 2013-02-18 DIAGNOSIS — IMO0001 Reserved for inherently not codable concepts without codable children: Secondary | ICD-10-CM | POA: Insufficient documentation

## 2013-02-18 DIAGNOSIS — M2560 Stiffness of unspecified joint, not elsewhere classified: Secondary | ICD-10-CM | POA: Insufficient documentation

## 2013-02-18 DIAGNOSIS — R262 Difficulty in walking, not elsewhere classified: Secondary | ICD-10-CM | POA: Insufficient documentation

## 2013-02-18 DIAGNOSIS — M169 Osteoarthritis of hip, unspecified: Secondary | ICD-10-CM | POA: Insufficient documentation

## 2013-02-18 DIAGNOSIS — M2550 Pain in unspecified joint: Secondary | ICD-10-CM | POA: Insufficient documentation

## 2013-02-18 DIAGNOSIS — R293 Abnormal posture: Secondary | ICD-10-CM | POA: Insufficient documentation

## 2013-03-10 ENCOUNTER — Ambulatory Visit: Payer: No Typology Code available for payment source

## 2013-04-30 ENCOUNTER — Ambulatory Visit (INDEPENDENT_AMBULATORY_CARE_PROVIDER_SITE_OTHER): Payer: No Typology Code available for payment source | Admitting: Internal Medicine

## 2013-04-30 ENCOUNTER — Encounter: Payer: Self-pay | Admitting: Internal Medicine

## 2013-04-30 VITALS — BP 113/74 | HR 109 | Temp 97.5°F | Ht 73.0 in | Wt 168.5 lb

## 2013-04-30 DIAGNOSIS — M25551 Pain in right hip: Secondary | ICD-10-CM

## 2013-04-30 DIAGNOSIS — M549 Dorsalgia, unspecified: Principal | ICD-10-CM

## 2013-04-30 DIAGNOSIS — I8390 Asymptomatic varicose veins of unspecified lower extremity: Secondary | ICD-10-CM | POA: Insufficient documentation

## 2013-04-30 DIAGNOSIS — G5710 Meralgia paresthetica, unspecified lower limb: Secondary | ICD-10-CM

## 2013-04-30 DIAGNOSIS — L988 Other specified disorders of the skin and subcutaneous tissue: Secondary | ICD-10-CM

## 2013-04-30 DIAGNOSIS — L959 Vasculitis limited to the skin, unspecified: Secondary | ICD-10-CM

## 2013-04-30 DIAGNOSIS — G8929 Other chronic pain: Secondary | ICD-10-CM

## 2013-04-30 DIAGNOSIS — M25559 Pain in unspecified hip: Secondary | ICD-10-CM

## 2013-04-30 DIAGNOSIS — M545 Low back pain, unspecified: Secondary | ICD-10-CM

## 2013-04-30 LAB — CBC WITH DIFFERENTIAL/PLATELET
BASOS ABS: 0 10*3/uL (ref 0.0–0.1)
Basophils Relative: 0 % (ref 0–1)
Eosinophils Absolute: 0.1 10*3/uL (ref 0.0–0.7)
Eosinophils Relative: 2 % (ref 0–5)
HCT: 36.2 % — ABNORMAL LOW (ref 39.0–52.0)
Hemoglobin: 11.7 g/dL — ABNORMAL LOW (ref 13.0–17.0)
LYMPHS PCT: 39 % (ref 12–46)
Lymphs Abs: 2.8 10*3/uL (ref 0.7–4.0)
MCH: 25.1 pg — ABNORMAL LOW (ref 26.0–34.0)
MCHC: 32.3 g/dL (ref 30.0–36.0)
MCV: 77.7 fL — ABNORMAL LOW (ref 78.0–100.0)
Monocytes Absolute: 0.4 10*3/uL (ref 0.1–1.0)
Monocytes Relative: 6 % (ref 3–12)
NEUTROS ABS: 3.7 10*3/uL (ref 1.7–7.7)
Neutrophils Relative %: 53 % (ref 43–77)
Platelets: 208 10*3/uL (ref 150–400)
RBC: 4.66 MIL/uL (ref 4.22–5.81)
RDW: 16.8 % — ABNORMAL HIGH (ref 11.5–15.5)
WBC: 7 10*3/uL (ref 4.0–10.5)

## 2013-04-30 LAB — COMPLETE METABOLIC PANEL WITH GFR
ALBUMIN: 4.9 g/dL (ref 3.5–5.2)
ALT: 39 U/L (ref 0–53)
AST: 52 U/L — ABNORMAL HIGH (ref 0–37)
Alkaline Phosphatase: 87 U/L (ref 39–117)
BILIRUBIN TOTAL: 0.5 mg/dL (ref 0.2–1.2)
BUN: 17 mg/dL (ref 6–23)
CHLORIDE: 105 meq/L (ref 96–112)
CO2: 26 meq/L (ref 19–32)
Calcium: 9.8 mg/dL (ref 8.4–10.5)
Creat: 1.36 mg/dL — ABNORMAL HIGH (ref 0.50–1.35)
GFR, EST AFRICAN AMERICAN: 66 mL/min
GFR, EST NON AFRICAN AMERICAN: 57 mL/min — AB
GLUCOSE: 95 mg/dL (ref 70–99)
POTASSIUM: 5.1 meq/L (ref 3.5–5.3)
SODIUM: 141 meq/L (ref 135–145)
TOTAL PROTEIN: 8 g/dL (ref 6.0–8.3)

## 2013-04-30 LAB — RHEUMATOID FACTOR

## 2013-04-30 MED ORDER — MELOXICAM 7.5 MG PO TABS: 7.5000 mg | ORAL_TABLET | Freq: Every day | ORAL | Status: DC

## 2013-04-30 NOTE — Assessment & Plan Note (Signed)
Patient continues to complain to pain   Plan  - referral to pain clinic  - will add Mobic to his Tylenol PM to see if that helps  - reevaluate in 2 weeks

## 2013-04-30 NOTE — Assessment & Plan Note (Addendum)
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  Cutaneous vasculitis can be associated with multiple diseases and the diagnosis of discharge can be very challenging. Examples of diseases that can be associated with this condition include: cutaneous leukocytoclastic vasculitis/cutaneous leukocytoclastic angiitis, polyarteritis nodosa, nodular vasculitis, granulomatosis with polyangiitis, eosinophilic granulomatosis with polyangiitis among others.  Plan: 1. Labs/imaging: CBC with differential, CMP, urinalysis, hepatitis B, and hepatitis C, ANA, rheumatoid factor, ESR, and CRP, HIV. Discussed with Dr Murlean Caller who also recommended referral for EMG 2. Therapy: Mobic (7.5 mg daily) for back pain can also help with cutaneous vasculitis. I advised the patient to elevate his legs to relieve swelling. 3. Follow up: 2 weeks to review results and see how he responds to trial of NSAIDS. Another option is prednisone if vasculitis is confirmed.

## 2013-04-30 NOTE — Assessment & Plan Note (Addendum)
Assessment: Most likely diagnosis: right Meralgia paraesthetica in view of physical examination findings with loss of sensation in the distribution of the right lateral femoral cutaneous nerve. Most likely this is due to. Chart review indicates 05/25/2010: Lumbar spine MRI : L1 -L2, right lateral recess protrusion and cephalad extension on the right L1 nerve root. L4-L5. The lateral femoral cutaneous nerves originates from L2/L3 which is consistent with the MRI findings as documented from 3 years ago. WUJ:[id number]:other possibilities considered include entrapment of the lateral femoral cutaneous nerve as it passes under the inguinal ligament. But the patient does not have any of the frequent associated conditions for this like obesity, diabetes mellitus of history of tight garments.   Plan: 1. Labs/imaging: EMG studies 2. Therapy: Meralgia paresthetica is a self-limited, benign disease in most patients. If symptoms persist, will try with gabapentin but patient can not afford this at this time.  3. Follow up: 2 weeks

## 2013-04-30 NOTE — Progress Notes (Signed)
Patient ID: Robert Bray, male   DOB: 11-22-1955, 58 y.o.   MRN: [id number]   Subjective:   HPI: [name] is a 58 y.o. gentleman with past medical history of chronic low back pain, and right hip pain, history of cocaine abuse, cigarette smoking, and alcohol abuse, presents to the clinic for followup visit.  Rash on the legs: Patient's main complaint today is itching a rash on his lower extremities which has been present for the last 2 years. This is the first time he is revealing to me this problem. He remembers that the rash started when he went out camping with his family 2 years ago and the next morning he noticed a rash on his feet. He initially ignored it thinking that it would resolve. However, since then, he has continued to have it on and off. It is seems to be aggravated by wearing tight socks.  He reports that sometimes his feet become swollen. He has not used any home remedies to relieve his symptoms. He denies any other areas of rash. He also denies constitutional symptoms associated with this rash. He reports that the rash is mainly limited to the legs from feet to the level of the shins. No history of easy bruising, or bleeding.  Low Back pain  Patient has a hx of chronic low back pain, and bilateral hip pain rt>lt. He gave up on his outpatient physical therapy last month after he was not seeing any benefits. He reports that his lower extremity pain is mostly severe when the weather is cold or when it rains. He uses Tylenol PM as needed which sometimes helps.   Kindly see the A&P for the status and details of the pt's chronic medical problems.   Past Medical History  Diagnosis Date  . Chronic low back pain 2000    Reports that he has been treated by orthopedic surgeon, but was unable to continue to to lack of insurance. He has had steroid injections in the hip. He attributes his back pain to his job, which involved lifting heavy objects.  . History of cocaine use       UDS was positive for cocaine in 2011.  Marland Kitchen Smoking   . Alcohol abuse    Current Outpatient Prescriptions  Medication Sig Dispense Refill  . diphenhydramine-acetaminophen (TYLENOL PM) 25-500 MG TABS Take 1-2 tablets by mouth at bedtime as needed.      . nicotine (NICODERM CQ - DOSED IN MG/24 HOURS) 14 mg/24hr patch Place 1 patch (14 mg total) onto the skin daily.  30 patch  0   No current facility-administered medications for this visit.   Family History  Problem Relation Age of Onset  . Hypertension Mother   . Hypertension Sister   . Kidney failure Mother     transplant in adult age. Unclear cause   . Breast cancer Sister   . Brain cancer Brother   . Breast cancer Sister     In remission    History   Social History  . Marital Status: Widowed    Spouse Name: N/A    Number of Children: N/A  . Years of Education: N/A   Social History Main Topics  . Smoking status: Current Every Day Smoker -- 0.30 packs/day for 15 years  . Smokeless tobacco: None  . Alcohol Use: Yes     Comment: Beer sometimes.  . Drug Use: None  . Sexual Activity: None   Other Topics Concern  . None  Social History Narrative  . None   Review of Systems: Constitutional: Denies fever, chills, diaphoresis, appetite change and fatigue.  Respiratory: Denies SOB, DOE, cough, chest tightness, and wheezing.  Cardiovascular: No chest pain, palpitations and leg swelling.  Gastrointestinal: No abdominal pain, nausea, vomiting, bloody stools Genitourinary: No dysuria, frequency, hematuria, or flank pain.   Objective:  Physical Exam: Filed Vitals:   04/30/13 1435  BP: 113/74  Pulse: 109  Temp: 97.5 F (36.4 C)  TempSrc: Oral  Height: 6\' 1"  (1.854 m)  Weight: 168 lb 8 oz (76.431 kg)  SpO2: 96%   General: Well nourished. Appears comfortable. No acute distress. His sister in exam room. antalgic gait.  SKIN: Please see pictures below. Numerous petechiae, hypopigmented macules are present on the lower  legs distributed to the level of the thighs. No other areas of rashes notable.  Lungs: CTA bilaterally.  Heart: RRR; no extra sounds or murmurs  Abdomen: Non-distended, normal BS, soft, nontender; no hepatosplenomegaly  Rectal exam: Normal, without prostate enlargement.  Musculoskeletal: Internal and external rotation of both hip joints elicit no tenderness. Straight leg raising sign is negative on both legs.  No increased tensity of the paraspinal muscles.  Extremities: No pedal edema. Good strength bilaterally.  Neurologic: Alert and oriented x3. No obvious neurologic deficits. Loss of sensation in the area of the right lateral femoral cutaneous nerve dermatome.        Assessment & Plan:  I have discussed my assessment and plan with Dr. Kem KaysPaya as detailed under problem based charting.

## 2013-04-30 NOTE — Patient Instructions (Signed)
Please elevate your legs at night I will send you for nerve conduction studies  Please take Mobic 7.5mg  once daily  I will refer you to the pain clinic  Please come back in 2 weeks

## 2013-05-01 LAB — URINALYSIS, ROUTINE W REFLEX MICROSCOPIC
Bilirubin Urine: NEGATIVE
Glucose, UA: NEGATIVE mg/dL
Hgb urine dipstick: NEGATIVE
KETONES UR: NEGATIVE mg/dL
Leukocytes, UA: NEGATIVE
NITRITE: NEGATIVE
Protein, ur: NEGATIVE mg/dL
SPECIFIC GRAVITY, URINE: 1.013 (ref 1.005–1.030)
UROBILINOGEN UA: 0.2 mg/dL (ref 0.0–1.0)
pH: 5.5 (ref 5.0–8.0)

## 2013-05-01 LAB — HEPATITIS PANEL, ACUTE
HCV Ab: NEGATIVE
Hep A IgM: NONREACTIVE
Hep B C IgM: NONREACTIVE
Hepatitis B Surface Ag: NEGATIVE

## 2013-05-01 LAB — ANA: Anti Nuclear Antibody(ANA): NEGATIVE

## 2013-05-01 LAB — SEDIMENTATION RATE: Sed Rate: 1 mm/hr (ref 0–16)

## 2013-05-01 LAB — HIV ANTIBODY (ROUTINE TESTING W REFLEX): HIV: NONREACTIVE

## 2013-05-01 LAB — HEPATITIS B SURFACE ANTIBODY,QUALITATIVE

## 2013-05-01 NOTE — Progress Notes (Signed)
INTERNAL MEDICINE TEACHING ATTENDING ADDENDUM - Phyllicia Dudek, DO: I reviewed with the resident Dr. Kazibwe, at the time of visit,  the medical history, physical examination, diagnosis and results of tests and treatment and I agree with the patient's care as documented. 

## 2013-05-14 ENCOUNTER — Encounter: Payer: No Typology Code available for payment source | Admitting: Internal Medicine

## 2013-05-28 ENCOUNTER — Ambulatory Visit (INDEPENDENT_AMBULATORY_CARE_PROVIDER_SITE_OTHER): Payer: No Typology Code available for payment source | Admitting: Internal Medicine

## 2013-05-28 VITALS — BP 134/83 | HR 119 | Temp 97.1°F | Ht 73.0 in | Wt 164.4 lb

## 2013-05-28 DIAGNOSIS — M549 Dorsalgia, unspecified: Secondary | ICD-10-CM

## 2013-05-28 DIAGNOSIS — L959 Vasculitis limited to the skin, unspecified: Secondary | ICD-10-CM

## 2013-05-28 DIAGNOSIS — M25551 Pain in right hip: Secondary | ICD-10-CM

## 2013-05-28 DIAGNOSIS — D509 Iron deficiency anemia, unspecified: Secondary | ICD-10-CM

## 2013-05-28 DIAGNOSIS — G5710 Meralgia paresthetica, unspecified lower limb: Secondary | ICD-10-CM

## 2013-05-28 DIAGNOSIS — L988 Other specified disorders of the skin and subcutaneous tissue: Secondary | ICD-10-CM

## 2013-05-28 DIAGNOSIS — G8929 Other chronic pain: Secondary | ICD-10-CM

## 2013-05-28 DIAGNOSIS — M25559 Pain in unspecified hip: Secondary | ICD-10-CM

## 2013-05-28 LAB — IRON AND TIBC
%SAT: 71 % — ABNORMAL HIGH (ref 20–55)
Iron: 260 ug/dL — ABNORMAL HIGH (ref 42–165)
TIBC: 368 ug/dL (ref 215–435)
UIBC: 108 ug/dL — ABNORMAL LOW (ref 125–400)

## 2013-05-28 MED ORDER — CYCLOBENZAPRINE HCL 5 MG PO TABS
5.0000 mg | ORAL_TABLET | Freq: Three times a day (TID) | ORAL | Status: DC | PRN
Start: 2013-05-28 — End: 2013-06-11

## 2013-05-28 MED ORDER — DIPHENHYDRAMINE-ZINC ACETATE 1-0.1 % EX CREA: TOPICAL_CREAM | Freq: Three times a day (TID) | CUTANEOUS | Status: DC | PRN

## 2013-05-28 MED ORDER — NAPROXEN SODIUM 220 MG PO CAPS: 440.0000 mg | ORAL_CAPSULE | Freq: Two times a day (BID) | ORAL | Status: DC

## 2013-05-28 MED ORDER — TRIAMCINOLONE 0.1 % CREAM:EUCERIN CREAM 1:1: 1.0000 "application " | TOPICAL_CREAM | Freq: Two times a day (BID) | CUTANEOUS | Status: DC

## 2013-05-28 NOTE — Assessment & Plan Note (Signed)
Assessment: From his last visit 2 weeks ago, I treated his rash as cutaneous vasculitis even though diagnosis remains uncertain. I recommended elevation of legs which has helped some but not resolved the rash. From physical exam, the rash is not much changed. Today, I discussed with Dr Ellwood Dense, my attending who favors contact dermatitis probably from [place] such as socks versus fungal infection. Several labs performed from his last OV including a CBC with differential, CMP, urinalysis, hepatitis B, and hepatitis C, ANA, rheumatoid factor, ESR, and CRP, HIV are not pointing to a specific diagnosis.   Plan: 1. Labs/imaging: none. Discussed results from last visit's labs with patient.  2. Therapy: trial of topical steroids. If the rash does not improve, we will consider treatment as a fungal infection or referral to dermatology to biopsy 3. Follow up: one month or prn.

## 2013-05-28 NOTE — Patient Instructions (Addendum)
We will try Aleve to see if that helps your pain better  Please stop taking Mobic  I have prescribed Flexeril to see if that helps your pain  I have prescribed Kenalog cream to use for your feet if they itch We will wait to see if we can have pain clinic appt scheduled soon  I have ordered some labs work for you.

## 2013-05-28 NOTE — Assessment & Plan Note (Signed)
Mobic has not helped his pain at all. Has no appointment with pain clinic yet.   Plan  - trial of Aleve - patient is not a candidate for Narcotics - will cont to pursue pain clinic

## 2013-05-28 NOTE — Progress Notes (Signed)
Patient ID: RUNE MENDEZ, male   DOB: [birth year], 58 y.o.   MRN: [id number]  Subjective:   HPI: Mr.Lenville L Poteete is a 58 y.o. gentleman with past medical history of chronic low back pain, and right hip pain, history of cocaine abuse, cigarette smoking, and alcohol abuse, presents to the clinic for followup visit.  During his last clinic visit on 04/30/2013 I started him on Mobic for his chronic low back pain with a referral to pain clinic. Patient reports that has not received any relief from Mobic. In terms of his bilateral lower extremity rash, I advised the patient to raise his legs, which has somehow helped but not resolved his rash. Labs from prior visit including ESR, ANA, CBC, CRP, HIV and viral panel have been non-revealing. He has not had his NCS performed yet.  He continues to complain of itching. No new symptoms in the interim.  Kindly see the A&P for the status and details of the pt's chronic medical problems.   Past Medical History  Diagnosis Date  . Chronic low back pain 2000    Reports that he has been treated by orthopedic surgeon, but was unable to continue to to lack of insurance. He has had steroid injections in the hip. He attributes his back pain to his job, which involved lifting heavy objects.  . History of cocaine use     UDS was positive for cocaine in 2011.  Marland Kitchen Smoking   . Alcohol abuse    Current Outpatient Prescriptions  Medication Sig Dispense Refill  . cyclobenzaprine (FLEXERIL) 5 MG tablet Take 1 tablet (5 mg total) by mouth every 8 (eight) hours as needed for muscle spasms.  30 tablet  1  . diphenhydramine-acetaminophen (TYLENOL PM) 25-500 MG TABS Take 1-2 tablets by mouth at bedtime as needed.      . Naproxen Sodium 220 MG CAPS Take 2 capsules (440 mg total) by mouth 2 (two) times daily with a meal.  60 each  0  . nicotine (NICODERM CQ - DOSED IN MG/24 HOURS) 14 mg/24hr patch Place 1 patch (14 mg total) onto the skin daily.  30 patch  0  .  Triamcinolone Acetonide (TRIAMCINOLONE 0.1 % CREAM : EUCERIN) CREA Apply 1 application topically 2 (two) times daily.  1 each  0   No current facility-administered medications for this visit.   Family History  Problem Relation Age of Onset  . Hypertension Mother   . Hypertension Sister   . Kidney failure Mother     transplant in adult age. Unclear cause   . Breast cancer Sister   . Brain cancer Brother   . Breast cancer Sister     In remission    History   Social History  . Marital Status: Widowed    Spouse Name: N/A    Number of Children: N/A  . Years of Education: N/A   Social History Main Topics  . Smoking status: Current Every Day Smoker -- 0.30 packs/day for 15 years  . Smokeless tobacco: Not on file  . Alcohol Use: Yes     Comment: Beer sometimes.  . Drug Use: Not on file  . Sexual Activity: Not on file   Other Topics Concern  . Not on file   Social History Narrative  . No narrative on file   Review of Systems: Constitutional: Denies fever, chills, diaphoresis, appetite change and fatigue.  Respiratory: Denies SOB, DOE, cough, chest tightness, and wheezing.  Cardiovascular: No chest pain, palpitations and  leg swelling.  Gastrointestinal: No abdominal pain, nausea, vomiting, bloody stools Genitourinary: No dysuria, frequency, hematuria, or flank pain.   Objective:  Physical Exam: Filed Vitals:   05/28/13 1438  BP: 134/83  Pulse: 119  Temp: 97.1 F (36.2 C)  TempSrc: Oral  Height: 6' 1"  (1.854 m)  Weight: 164 lb 6.4 oz (74.571 kg)  SpO2: 96%   General: Well nourished. Appears comfortable. No acute distress. His sister in exam room. antalgic gait.      SKIN: Please see pictures below. Numerous petechiae, hypopigmented macules are present on the lower legs distributed to the level of the thighs. No other areas of rashes notable.  Lungs: CTA bilaterally.  Heart: RRR; no extra sounds or murmurs  Abdomen: Non-distended, normal BS, soft, nontender; no  hepatosplenomegaly  Rectal exam: Normal, without prostate enlargement.  Musculoskeletal: Internal and external rotation of both hip joints elicit no tenderness. Straight leg raising sign is negative on both legs.  No increased tensity of the paraspinal muscles.  Extremities: No pedal edema. Good strength bilaterally.  Neurologic: Alert and oriented x3. No obvious neurologic deficits.   Assessment & Plan:  I have discussed my assessment and plan with Dr. Ellwood Dense as detailed under problem based charting.

## 2013-05-29 ENCOUNTER — Telehealth: Payer: Self-pay | Admitting: *Deleted

## 2013-05-29 LAB — FERRITIN: Ferritin: 316 ng/mL (ref 22–322)

## 2013-05-29 NOTE — Telephone Encounter (Signed)
Fax from [facility] - needs clarification on Eucerin cream rx written yesterday; is this to be 1:1 strength or do u want straight triamcinolone/ Thanks

## 2013-05-30 MED ORDER — TRIAMCINOLONE ACETONIDE 0.5 % EX OINT: 1.0000 "application " | TOPICAL_OINTMENT | Freq: Two times a day (BID) | CUTANEOUS | Status: DC

## 2013-05-30 NOTE — Telephone Encounter (Signed)
Changed cream to 0.5%. Called patient to pick medication from pharmacy.

## 2013-06-02 NOTE — Progress Notes (Signed)
Case discussed with Dr.Kazibwe at the time of the visit.  We reviewed the resident's history and exam and pertinent patient test results.  I agree with the assessment, diagnosis, and plan of care documented in the resident's note.    

## 2013-06-16 NOTE — Addendum Note (Signed)
Addended by: Neomia DearPOWERS, Messina Kosinski E on: 06/16/2013 07:08 PM   Modules accepted: Orders

## 2013-06-16 NOTE — Addendum Note (Signed)
Addended by: Neomia DearPOWERS, Kastin Cerda E on: 06/16/2013 07:09 PM   Modules accepted: Orders

## 2013-06-30 ENCOUNTER — Encounter: Payer: Self-pay | Admitting: Internal Medicine

## 2013-06-30 ENCOUNTER — Ambulatory Visit (INDEPENDENT_AMBULATORY_CARE_PROVIDER_SITE_OTHER): Payer: No Typology Code available for payment source | Admitting: Internal Medicine

## 2013-06-30 VITALS — BP 111/73 | HR 100 | Temp 97.0°F | Ht 73.0 in | Wt 164.6 lb

## 2013-06-30 DIAGNOSIS — G8929 Other chronic pain: Secondary | ICD-10-CM

## 2013-06-30 DIAGNOSIS — L959 Vasculitis limited to the skin, unspecified: Secondary | ICD-10-CM

## 2013-06-30 DIAGNOSIS — L988 Other specified disorders of the skin and subcutaneous tissue: Secondary | ICD-10-CM

## 2013-06-30 DIAGNOSIS — M549 Dorsalgia, unspecified: Secondary | ICD-10-CM

## 2013-06-30 MED ORDER — TRIAMCINOLONE ACETONIDE 0.5 % EX OINT: 1.0000 "application " | TOPICAL_OINTMENT | Freq: Two times a day (BID) | CUTANEOUS | Status: DC

## 2013-06-30 NOTE — Patient Instructions (Signed)
I have reordered steroid cream at the pharmacy. As you cannot afford this until next month I recommend using max strength over the counter cortisone cream.  I have referred you to dermatology for your foot rash.  Please take 1000 mg tylenol three times daily for the next 2 weeks. If this is helping your pain, please continue this regimen.  Please follow up as needed for these problems.

## 2013-06-30 NOTE — Assessment & Plan Note (Signed)
Patient continues to have problems with lower extremity rash. See previous notes for prior workup. Appears stable. Plan for referral to dermatology for biopsy and treatment as indicated.  I reordered the steroid cream for the patient. He states that he cannot afford it until next month. I rec using OTC max strength steroid cream in the interim.

## 2013-06-30 NOTE — Progress Notes (Signed)
INTERNAL MEDICINE TEACHING ATTENDING ADDENDUM - Nischal Narendra, MD: I reviewed and discussed at the time of visit with the resident Dr. Komanski, the patient's medical history, physical examination, diagnosis and results of tests and treatment and I agree with the patient's care as documented. 

## 2013-06-30 NOTE — Progress Notes (Signed)
Patient ID: Robert Bray, male   DOB: 10/16/1955, 58 y.o.   MRN: [id number]    Subjective:   Patient ID: Robert Bray male   DOB: 08/09/1955 58 y.o.   MRN: [id number]  HPI: RobertBowdy Bray Juanetta GoslingHawkins is a 58 y.o. man with a pmhx of low back and hip pain who presents for follow regarding his bil foot and leg rash. The patient continues to have itching. He states that it is neither better nor worse. The rash is located on his bil feet, ankle and lower leg.    Past Medical History  Diagnosis Date  . Chronic low back pain 2000    Reports that he has been treated by orthopedic surgeon, but was unable to continue to to lack of insurance. He has had steroid injections in the hip. He attributes his back pain to his job, which involved lifting heavy objects.  . History of cocaine use     UDS was positive for cocaine in 2011.  Marland Kitchen. Smoking   . Alcohol abuse    Current Outpatient Prescriptions  Medication Sig Dispense Refill  . diphenhydramine-acetaminophen (TYLENOL PM) 25-500 MG TABS Take 1-2 tablets by mouth at bedtime as needed.       No current facility-administered medications for this visit.   Family History  Problem Relation Age of Onset  . Hypertension Mother   . Hypertension Sister   . Kidney failure Mother     transplant in adult age. Unclear cause   . Breast cancer Sister   . Brain cancer Brother   . Breast cancer Sister     In remission    History   Social History  . Marital Status: Widowed    Spouse Name: N/A    Number of Children: N/A  . Years of Education: N/A   Social History Main Topics  . Smoking status: Current Every Day Smoker -- 0.30 packs/day for 15 years  . Smokeless tobacco: None  . Alcohol Use: Yes     Comment: Beer sometimes.  . Drug Use: None  . Sexual Activity: None   Other Topics Concern  . None   Social History Narrative  . None   Review of Systems: Review of Systems  Constitutional: Negative for fever, chills and malaise/fatigue.  HENT:  Negative for sore throat.   Respiratory: Negative for cough and shortness of breath.   Cardiovascular: Negative for chest pain, palpitations and orthopnea.  Gastrointestinal: Negative for heartburn, nausea, vomiting, abdominal pain and diarrhea.  Musculoskeletal: Positive for back pain, joint pain and myalgias. Negative for falls.  Skin: Positive for itching and rash.  Neurological: Negative for weakness and headaches.    Objective:  Physical Exam: Filed Vitals:   06/30/13 1349  BP: 111/73  Pulse: 100  Temp: 97 F (36.1 C)  TempSrc: Oral  Height: 6\' 1"  (1.854 m)  Weight: 164 lb 9.6 oz (74.662 kg)  SpO2: 96%   Physical Exam  Constitutional: He appears well-developed and well-nourished. No distress.  HENT:  Head: Normocephalic and atraumatic.  Cardiovascular: Normal rate, regular rhythm, normal heart sounds and intact distal pulses.  Exam reveals no gallop and no friction rub.   No murmur heard. Pulmonary/Chest: Effort normal and breath sounds normal. No respiratory distress. He has no wheezes. He has no rales.  Musculoskeletal: He exhibits no edema.       Legs: Patient has low back and hip pain consistent with baseline.  Skin: He is not diaphoretic.     There are  numerous hyperpigmented areas. Some of these areas are palpable. They are non-blanching. Consistent with petechia and palpable purpura. No scaling observed.    Assessment & Plan:

## 2013-06-30 NOTE — Assessment & Plan Note (Signed)
The pain appears to be at baseline. The patient has been using tylenol every other day 1-2 tablets. I rec increasing to 1000 mg TID for two weeks.  I instructed the pateint to continue this regimen if it is helpful. If it fails to improve his symptoms, I rec going back to his previous regimen.

## 2013-09-11 ENCOUNTER — Ambulatory Visit: Payer: Self-pay

## 2013-10-17 ENCOUNTER — Telehealth: Payer: Self-pay | Admitting: *Deleted

## 2013-10-17 NOTE — Telephone Encounter (Signed)
CALLED PATIENT TO ASK HIM TO COME IN TO SEE DEBORAH HILL / CARD HAS EXPIRED 6/22/015/ WILL GIVE TO DEBORAH HILL TO FOLLOWUP.Robert Bray. .Robert Bray

## 2013-11-05 ENCOUNTER — Telehealth: Payer: Self-pay | Admitting: *Deleted

## 2014-01-30 ENCOUNTER — Ambulatory Visit (INDEPENDENT_AMBULATORY_CARE_PROVIDER_SITE_OTHER): Payer: Medicare Other | Admitting: Internal Medicine

## 2014-01-30 ENCOUNTER — Encounter: Payer: Self-pay | Admitting: Internal Medicine

## 2014-01-30 VITALS — BP 124/72 | HR 96 | Temp 98.2°F | Ht 72.0 in | Wt 158.3 lb

## 2014-01-30 DIAGNOSIS — Z0000 Encounter for general adult medical examination without abnormal findings: Secondary | ICD-10-CM

## 2014-01-30 DIAGNOSIS — Z8639 Personal history of other endocrine, nutritional and metabolic disease: Secondary | ICD-10-CM

## 2014-01-30 DIAGNOSIS — L959 Vasculitis limited to the skin, unspecified: Secondary | ICD-10-CM

## 2014-01-30 DIAGNOSIS — N182 Chronic kidney disease, stage 2 (mild): Secondary | ICD-10-CM

## 2014-01-30 DIAGNOSIS — Z23 Encounter for immunization: Secondary | ICD-10-CM

## 2014-01-30 LAB — POCT GLYCOSYLATED HEMOGLOBIN (HGB A1C): Hemoglobin A1C: 4.8

## 2014-01-30 LAB — BASIC METABOLIC PANEL WITH GFR
BUN: 10 mg/dL (ref 6–23)
CHLORIDE: 103 meq/L (ref 96–112)
CO2: 25 meq/L (ref 19–32)
Calcium: 9.9 mg/dL (ref 8.4–10.5)
Creat: 1.13 mg/dL (ref 0.50–1.35)
GFR, EST NON AFRICAN AMERICAN: 71 mL/min
GFR, Est African American: 82 mL/min
Glucose, Bld: 100 mg/dL — ABNORMAL HIGH (ref 70–99)
POTASSIUM: 4.5 meq/L (ref 3.5–5.3)
Sodium: 141 mEq/L (ref 135–145)

## 2014-01-30 NOTE — Patient Instructions (Addendum)
It was a pleasure meeting you!  I will call you with the results of your labwork and try to track down the report from your skin doctor.  We will help schedule an appointment with [facility].   Let's plan to see each other in 1-2 months.    General Instructions:   Please bring your medicines with you each time you come to clinic.  Medicines may include prescription medications, over-the-counter medications, herbal remedies, eye drops, vitamins, or other pills.   Progress Toward Treatment Goals:  Treatment Goal 11/07/2012  Stop smoking smoking the same amount    Self Care Goals & Plans:  Self Care Goal 01/30/2014  Manage my medications take my medicines as prescribed; bring my medications to every visit  Eat healthy foods drink diet soda or water instead of juice or soda; eat more vegetables; eat foods that are low in salt; eat baked foods instead of fried foods; eat fruit for snacks and desserts  Stop smoking -    No flowsheet data found.   Care Management & Community Referrals:  No flowsheet data found.

## 2014-01-30 NOTE — Assessment & Plan Note (Signed)
Overview Prior creatinine values have been trending up. He reports he is not diabetic.  Assessment -Needs to be reassessed  Plan -Recheck BMET & A1c  ADDENDUM 01/30/2014 6:22 PM:  Creatinine stable, A1c<6 are reassuring for no undiagnosed DM2.

## 2014-01-30 NOTE — Assessment & Plan Note (Addendum)
Overview He was able to get a shave biopsy done by the dermatologist about 1-2 months ago and that the lesions were not cancerous. He was told to use Vaseline to help the lesion heal but felt it was healing better when left uncovered. He reports swelling in his ankle as well. He keeps his legs elevated overnight to help keep the swelling down in his ankles but denies any kind of dyspnea or leg edema. The biopsy site has not appeared inflamed, tender, or purulent.   Assessment -Prior labs have been unremarkable for causes of a vasculitic process -Likely unable to close on its own given the width of the site of biopsy  Plan -Refer to Wound Care for further management -Follow-up recommendations at next visit -Collect records from Dermatology  ADDENDUM 02/22/2014 12:36 AM:  -Seen by Dr. 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

## 2014-01-30 NOTE — Progress Notes (Signed)
   Subjective:    Patient ID: Robert Bray, male    DOB: 01/01/1956, 58 y.o.   MRN: [id number]  HPI Robert Bray is a 58 year old with chronic low back/hip pain, h/o polysubstance abuse (cocaine, tobacco, alcohol), who presents for a follow-up visit with his brother.  Please see assessment & plan for documentation of each problem.   Review of Systems  Constitutional: Negative for fever and chills.  Respiratory: Negative for shortness of breath.   Cardiovascular: Negative for chest pain.  Gastrointestinal: Negative for nausea, abdominal pain and diarrhea.       Objective:   Physical Exam  Constitutional: He is oriented to person, place, and time. He appears well-developed and well-nourished. No distress.  HENT:  Head: Normocephalic and atraumatic.  Eyes: Conjunctivae are normal. Pupils are equal, round, and reactive to light.  Cardiovascular: Normal rate, regular rhythm and normal heart sounds.  Exam reveals no gallop and no friction rub.   No murmur heard. Pulmonary/Chest: Effort normal. No respiratory distress. He has no wheezes. He has no rales.  Abdominal: Soft. Bowel sounds are normal. He exhibits no distension. There is no tenderness.  Neurological: He is alert and oriented to person, place, and time. No cranial nerve deficit. Coordination normal.  Skin: Multiple hyperpigmented areas overlying L foot near ankle with a 3-cm circle shave biopsy site that appears with surrounding area free of erythema, warmth, or purulence  Psychiatric: His behavior is normal.         Assessment & Plan:

## 2014-02-02 ENCOUNTER — Telehealth: Payer: Self-pay | Admitting: Internal Medicine

## 2014-02-02 NOTE — Telephone Encounter (Signed)
I spoke to his brother Harrold Donathathan by phone to review results per the patient's permission at our last office visit on Friday 11/13.  I explained that he did not have diabetes and that his bloodwork looked unremarkable. I did explain however that while his kidneys appeared healthy, his renal function did appear mildly decreased which is to expected with aging.  He asked for me for any recommendations, and I reiterated just a balanced diet and regular exercise. He also reported that his brother is still smoking and has been urging him to cut back.  I reassured him that I will address his smoking at future visits and that I like to establish rapport before addressing everything.

## 2014-02-03 NOTE — Progress Notes (Signed)
Internal Medicine Clinic Attending  I saw and evaluated the patient.  I personally confirmed the key portions of the history and exam documented by Dr. Patel and I reviewed pertinent patient test results.  The assessment, diagnosis, and plan were formulated together and I agree with the documentation in the resident's note.  

## 2014-02-03 NOTE — Addendum Note (Signed)
Addended by: Debe CoderMULLEN, EMILY B on: 02/03/2014 10:25 AM   Modules accepted: Level of Service

## 2014-02-23 ENCOUNTER — Encounter (HOSPITAL_BASED_OUTPATIENT_CLINIC_OR_DEPARTMENT_OTHER): Payer: Medicare Other

## 2014-02-23 NOTE — Addendum Note (Signed)
Addended by: Neomia DearPOWERS, Lailoni Baquera E on: 02/23/2014 04:49 PM   Modules accepted: Orders

## 2014-04-02 ENCOUNTER — Telehealth: Payer: Self-pay | Admitting: *Deleted

## 2014-04-02 NOTE — Telephone Encounter (Signed)
Pt's sister calls and states pt wants some pain medicine, states he cannot wait til his appt 1/22 and would like some flexeril and something for pain

## 2014-04-06 NOTE — Telephone Encounter (Signed)
I think it's hard for me to prescribe anything if I don't know what kind of pain he has. I will try to call them later today once my inpatient responsibilities are completed, but you may call them if you are available.

## 2014-04-10 ENCOUNTER — Encounter: Payer: Medicare Other | Admitting: Internal Medicine

## 2014-04-16 MED ORDER — CYCLOBENZAPRINE HCL 5 MG PO TABS: 5.0000 mg | ORAL_TABLET | Freq: Three times a day (TID) | ORAL | Status: DC | PRN

## 2014-04-16 NOTE — Addendum Note (Signed)
Addended by: Beather ArbourPATEL, RUSHIL V on: 04/16/2014 07:18 PM   Modules accepted: Orders

## 2014-04-16 NOTE — Telephone Encounter (Signed)
Pain is in back and hips Pain scale 0-10, states 4 to 5, when he goes to bed, it hurts and he cannot sleep Pain is "achy"

## 2014-04-16 NOTE — Telephone Encounter (Signed)
I would recommend alternating Tylenol 500mg  with Flexeril 5mg  TID prn as needed for low back pain. If that doesn't work, we can reassess at next week's appointment.

## 2014-04-17 ENCOUNTER — Encounter: Payer: Commercial Managed Care - HMO | Admitting: Internal Medicine

## 2014-04-23 ENCOUNTER — Encounter: Payer: Commercial Managed Care - HMO | Admitting: Internal Medicine

## 2014-05-07 ENCOUNTER — Encounter: Payer: Self-pay | Admitting: Internal Medicine

## 2014-05-07 ENCOUNTER — Ambulatory Visit (INDEPENDENT_AMBULATORY_CARE_PROVIDER_SITE_OTHER): Payer: Commercial Managed Care - HMO | Admitting: Internal Medicine

## 2014-05-07 VITALS — BP 125/77 | HR 113 | Temp 97.9°F | Ht 73.0 in | Wt 161.1 lb

## 2014-05-07 DIAGNOSIS — G8929 Other chronic pain: Secondary | ICD-10-CM | POA: Diagnosis not present

## 2014-05-07 DIAGNOSIS — I8390 Asymptomatic varicose veins of unspecified lower extremity: Secondary | ICD-10-CM | POA: Diagnosis not present

## 2014-05-07 DIAGNOSIS — R972 Elevated prostate specific antigen [PSA]: Secondary | ICD-10-CM | POA: Diagnosis not present

## 2014-05-07 DIAGNOSIS — M25552 Pain in left hip: Secondary | ICD-10-CM | POA: Diagnosis not present

## 2014-05-07 DIAGNOSIS — M25559 Pain in unspecified hip: Secondary | ICD-10-CM | POA: Diagnosis not present

## 2014-05-07 DIAGNOSIS — M160 Bilateral primary osteoarthritis of hip: Secondary | ICD-10-CM | POA: Insufficient documentation

## 2014-05-07 MED ORDER — IBUPROFEN 400 MG PO TABS: 400.0000 mg | ORAL_TABLET | Freq: Four times a day (QID) | ORAL | Status: DC | PRN

## 2014-05-07 NOTE — Progress Notes (Addendum)
   Subjective:    Patient ID: Robert Bray, male    DOB: 10/05/1955, 59 y.o.   MRN: [id number]  HPI Robert Bray is a 59 year old male with history of polysubstance abuse, chronic hip pain who presents today for follow-up visit. He is accompanied today by his brother, Robert Bray. Please see assessment & plan for documentation of each problem.   Review of Systems  Constitutional: Negative for fever.  Respiratory: Negative for shortness of breath.   Cardiovascular: Negative for chest pain.  Gastrointestinal: Negative for nausea, vomiting, abdominal pain and diarrhea.  Genitourinary: Negative for hematuria, enuresis and difficulty urinating.  Musculoskeletal:       Hip pain, worse on the left.  Neurological: Negative for dizziness.       Objective:   Physical Exam  Genitourinary: Rectal exam shows no external hemorrhoid, no tenderness and anal tone normal. Prostate is not enlarged and not tender.   Constitutional: He is oriented to person, place, and time. Thin appearing. No distress.  Head: Normocephalic and atraumatic.  Eyes: Conjunctivae are normal.  Cardiovascular: Normal rate, regular rhythm and normal heart sounds.  Exam reveals no gallop and no friction rub.   No murmur heard. Pulmonary/Chest: Effort normal. No respiratory distress. He has no wheezes. He has no rales.  Abdominal: Soft. Bowel sounds are normal. He exhibits no distension. There is no tenderness.  CVA tenderness negative.  Neurological: He is alert and oriented to person, place, and time. Coordination normal.  Hip: Limited range of motion with left hip external rotation and internal rotation due to pain.  Extremities: No tibial edema noted. Punch biopsy site appears healed. Back: Negative straight leg test bilaterally. Skin: Skin is warm and dry. He is not diaphoretic.  Psychiatric: His behavior is normal.          Assessment & Plan:

## 2014-05-07 NOTE — Assessment & Plan Note (Addendum)
Overview -He reports a long-term history of hip pain though worse on his left specially in the setting of cold weather.  -He reports receiving steroid injections bilaterally in his hips though feels they made his pain worse.  -Movements which worsen his left hip pain include bending down to pick something up.  -He feels Flexeril and heating pad seemed to improve his pain.  -He denies any numbness or tingling, trouble passing urine or stool. Pain does not radiate down the length of his leg but rather the length of the femur from hip to knee.  -Of note, left hip MRI in 2014 showed osteoarthritic changes and tendinitis.  Assessment -Lack of neurologic symptoms as well as the absence of bowel/bladder dysfunction is reassuring for no cord compression or spinal cord involvement, especially given that there was a right lesion noted in the peripheral zone numbers prostate gland on the 2014 MRI of his left hip -Likely related to his osteoarthritis with a possible musculoskeletal component as he reports some kind of improvement with Flexeril  Plan -Order complete hip x-rays and PSA -Refer to orthopedics for further management as he is agreeable to start returning to see them again -Advised him to take ibuprofen 200-400 mg every 4-6 hours as needed for pain -Recommend he follow-up with me in 3-6 months after he has a chance to see the orthopedist  ADDENDUM 05/08/2014  9:29 AM:  -PSA 3.2, stable from 1 year ago is reassuring.

## 2014-05-07 NOTE — Assessment & Plan Note (Signed)
-  Appears healed over and no longer causes him problems or issue and is why he did not follow up with wound care back in December -He and his brother reassured that it is not a serious lesion.

## 2014-05-07 NOTE — Patient Instructions (Signed)
  Please try to bring all your medicines next time. This will help us keep you safe from mistakes.  Please take ibuprofen 400 mg every 4-6 hours as needed for pain. You can continue using the heating pad as well.  Also, you may show up when you would like at your convenience to get your hip x-rays done.  Please see me back in 3-6 months.

## 2014-05-08 LAB — PSA: PSA: 3.2 ng/mL (ref <=4.00)

## 2014-05-08 NOTE — Progress Notes (Signed)
Internal Medicine Clinic Attending Date of visit: 05/07/2014  I saw and evaluated the patient.  I personally confirmed the key portions of the history and exam documented by Dr. Allena KatzPatel and I reviewed pertinent patient test results.  The assessment, diagnosis, and plan were formulated together and I agree with the documentation in the resident's note.

## 2014-05-29 ENCOUNTER — Telehealth: Payer: Self-pay

## 2014-08-29 ENCOUNTER — Emergency Department (HOSPITAL_COMMUNITY)
Admission: EM | Admit: 2014-08-29 | Discharge: 2014-08-29 | Discharge status: Absent without leave | Payer: Commercial Managed Care - HMO

## 2014-10-02 ENCOUNTER — Encounter: Payer: Self-pay | Admitting: Internal Medicine

## 2014-10-02 ENCOUNTER — Ambulatory Visit (INDEPENDENT_AMBULATORY_CARE_PROVIDER_SITE_OTHER): Payer: Commercial Managed Care - HMO | Admitting: Internal Medicine

## 2014-10-02 ENCOUNTER — Ambulatory Visit (HOSPITAL_COMMUNITY)
Admission: RE | Admit: 2014-10-02 | Discharge: 2014-10-02 | Disposition: A | Discharge status: Home / Self Care | Payer: Commercial Managed Care - HMO | Source: Ambulatory Visit | Attending: Internal Medicine | Admitting: Internal Medicine

## 2014-10-02 VITALS — BP 120/72 | HR 91 | Temp 97.7°F | Ht 73.0 in | Wt 163.1 lb

## 2014-10-02 DIAGNOSIS — R55 Syncope and collapse: Secondary | ICD-10-CM

## 2014-10-02 DIAGNOSIS — I440 Atrioventricular block, first degree: Secondary | ICD-10-CM | POA: Diagnosis not present

## 2014-10-02 DIAGNOSIS — D509 Iron deficiency anemia, unspecified: Secondary | ICD-10-CM | POA: Diagnosis not present

## 2014-10-02 DIAGNOSIS — G8929 Other chronic pain: Secondary | ICD-10-CM | POA: Diagnosis not present

## 2014-10-02 DIAGNOSIS — R404 Transient alteration of awareness: Secondary | ICD-10-CM

## 2014-10-02 DIAGNOSIS — M25552 Pain in left hip: Secondary | ICD-10-CM

## 2014-10-02 LAB — COMPREHENSIVE METABOLIC PANEL
ALBUMIN: 4.2 g/dL (ref 3.5–5.0)
ALK PHOS: 76 U/L (ref 38–126)
ALT: 41 U/L (ref 17–63)
ANION GAP: 9 (ref 5–15)
AST: 49 U/L — AB (ref 15–41)
BUN: 14 mg/dL (ref 6–20)
CALCIUM: 9.6 mg/dL (ref 8.9–10.3)
CO2: 25 mmol/L (ref 22–32)
CREATININE: 1.17 mg/dL (ref 0.61–1.24)
Chloride: 102 mmol/L (ref 101–111)
GFR calc Af Amer: >60 mL/min (ref >60)
GFR calc non Af Amer: >60 mL/min (ref >60)
Glucose, Bld: 95 mg/dL (ref 65–99)
POTASSIUM: 4.8 mmol/L (ref 3.5–5.1)
Sodium: 136 mmol/L (ref 135–145)
Total Bilirubin: 0.4 mg/dL (ref 0.3–1.2)
Total Protein: 7.4 g/dL (ref 6.5–8.1)

## 2014-10-02 LAB — CBC
HCT: 32.3 % — ABNORMAL LOW (ref 39.0–52.0)
Hemoglobin: 10.6 g/dL — ABNORMAL LOW (ref 13.0–17.0)
MCH: 26.1 pg (ref 26.0–34.0)
MCHC: 32.8 g/dL (ref 30.0–36.0)
MCV: 79.6 fL (ref 78.0–100.0)
PLATELETS: 164 10*3/uL (ref 150–400)
RBC: 4.06 MIL/uL — ABNORMAL LOW (ref 4.22–5.81)
RDW: 15.4 % (ref 11.5–15.5)
WBC: 8.8 10*3/uL (ref 4.0–10.5)

## 2014-10-02 LAB — TSH: TSH: 0.841 u[IU]/mL (ref 0.350–4.500)

## 2014-10-02 NOTE — Assessment & Plan Note (Addendum)
Overview Last Saturday [6 days ago], reported falling down while going to the bathroom at home and reported "passing out" for 2-3 minutes thereafter. The next thing remembers, his friend was helping him get up. He denies any sweating, palpitations, cough, micturition/defecation prior to the event. He denies any prior cardiac history or strokes or any family history of either condition. No family history of syncope either though he reports having a similar episode nearly 25 years ago while at work which he attributed to the air being dirty with dust though was taken from a medical evaluation but was never told of anything significant. He also denies any illicit drug use prior to this syncopal episode.   Assessment Syncope of unknown etiology.  Plan -Check TSH, CBC, CMET, EKG -Order outpatient echocardiogram  ADDENDUM 10/05/2014  5:31 PM:  -EKG with normal rate, rhythm, axis. PR interval prolonged at 252 ms consistent with first-degree AV block. -Electrolytes, TSH within normal limits and are reassuring.  -AST mildly elevated though appears chronic. HCV, HBV, HIV negative last year. -Mildly anemic with hemoglobin 11 though has dropped from 13 over an interval of 2 years; will assess further at follow-up -Called brother with results per patient's preference on 10/02/14

## 2014-10-02 NOTE — Progress Notes (Signed)
   Subjective:    Patient ID: Robert Bray, male    DOB: 01/01/1956, 59 y.o.   MRN: [id number]  HPI Robert Bray is a 59 year old male with history of polysubstance abuse, chronic hip pain who presents today with his brother, Robert Bray. Please see assessment & plan for documentation of each problem.   Review of Systems  Respiratory: Negative for shortness of breath.   Cardiovascular: Negative for chest pain and leg swelling.  Gastrointestinal: Negative for nausea, vomiting, abdominal pain, diarrhea and blood in stool.  Genitourinary: Negative for dysuria.  Neurological: Positive for syncope. Negative for dizziness.       Objective:   Physical Exam  Constitutional: Thin African-American male. Oriented to person, place, and time. No distress.  HENT:  Head: Normocephalic and atraumatic.  Eyes: Conjunctivae are normal. Pupils are equal, round, and reactive to light.  Cardiovascular: Normal rate, regular rhythm and normal heart sounds.  Exam reveals no gallop and no friction rub. No carotid bruits. No murmur heard. Pulmonary/Chest: Effort normal. No respiratory distress. He has no wheezes. He has no rales.  Neurological: He is alert and oriented to person, place, and time. No cranial nerve deficit. Coordination normal. Upper and lower extremity strength 5 out of 5 bilaterally though pain noted with hip abduction and abduction. Skin: Skin is warm and dry. He is not diaphoretic.  Psychiatric: His behavior is normal.         Assessment & Plan:

## 2014-10-02 NOTE — Assessment & Plan Note (Addendum)
Per his brother, his hip pain has been worsening over the interval since his last office visit. Per chart review, it appears did not follow-up with orthopedics because of financial reasons. I discussed with him that even if we did reimage his hip, the likely management for arthritis would entail steroid injections and physical therapy to which they acknowledged understanding. They will try to get follow-up hip x-rays the same time as his outpatient echocardiogram.

## 2014-10-02 NOTE — Patient Instructions (Addendum)
Please come back to clinic next week. We will order the echo and hip xrays and have them to review at your next appointment.  If you feel different or pass out again, please call [phone].

## 2014-10-05 NOTE — Progress Notes (Signed)
Internal Medicine Clinic Attending  Case discussed with Dr. Patel,Rushil soon after the resident saw the patient.  We reviewed the resident's history and exam and pertinent patient test results.  I agree with the assessment, diagnosis, and plan of care documented in the resident's note. 

## 2014-10-07 DIAGNOSIS — Z1589 Genetic susceptibility to other disease: Secondary | ICD-10-CM | POA: Insufficient documentation

## 2014-10-07 NOTE — Assessment & Plan Note (Addendum)
ADDENDUM 10/07/2014  3:34 PM:  Upon review of CBC over the last three years, he has demonstrated a 1 point drop from 13 to 11 over two years with concomittant drops in hematocrit and platelets. He is borderline microcytic as well. His anemia panel is somewhat puzzling to me as it is suggestive of iron overload though has never on iron therapy or received transfusion products. Given his recent syncopal episode, I think his anemia needs further workup: Hemolysis appears less likely given that his bilirubin has been within normal limits.   -Retic count  -CBC with differential  -Peripheral smear  -Iron studies -Ferritin  ADDENDUM 10/09/2014  5:22 PM:  I reviewed this case with Dr. Cyndie ChimeGranfortuna, and he recommended we should also add on hemochromatosis screening given the iron studies suggestive of iron overload. Myelodysplasia is another possibility as well.

## 2014-10-07 NOTE — Addendum Note (Signed)
Addended by: Beather ArbourPATEL, Rynell Ciotti V on: 10/07/2014 03:41 PM   Modules accepted: Orders

## 2014-10-08 NOTE — Telephone Encounter (Signed)
done

## 2014-10-13 ENCOUNTER — Telehealth: Payer: Self-pay | Admitting: Internal Medicine

## 2014-10-13 NOTE — Addendum Note (Signed)
Addended by: Beather Arbour on: 10/13/2014 05:11 PM   Modules accepted: Orders

## 2014-10-13 NOTE — Telephone Encounter (Addendum)
  Reason for call:   I placed an outgoing call to [name], brother of [name] ,at 5:10  PM regarding his brother's workup. [name] has permitted me to speak with his brother regarding his medical issues in past appointments.   Assessment/ Plan:   I explained to him the importance of completing his workup which included the echo, bloodwork, X-ray, and his brother reported to me that he is planning to come in on Friday, 7/29.   I told him some possibilities include ongoing blood loss in GI tract, issues with his bone marrow, problems with how his body metabolized iron and how each of these tests would better assess him.  He thanked me for my time, and I told him to call should he have any questions. I also explained to him that I will share results as they come back, and if there's something urgent, then I will follow it up accordingly.  As always, pt is advised that if symptoms worsen or new symptoms arise, they should go to an urgent care facility or to to ER for further evaluation.   Robert Arbour, MD   10/13/2014, 5:21 PM

## 2014-10-16 ENCOUNTER — Other Ambulatory Visit (INDEPENDENT_AMBULATORY_CARE_PROVIDER_SITE_OTHER): Payer: Commercial Managed Care - HMO

## 2014-10-16 ENCOUNTER — Ambulatory Visit (HOSPITAL_COMMUNITY)
Admission: RE | Admit: 2014-10-16 | Discharge: 2014-10-16 | Disposition: A | Discharge status: Home / Self Care | Payer: Commercial Managed Care - HMO | Source: Ambulatory Visit | Attending: Internal Medicine | Admitting: Internal Medicine

## 2014-10-16 DIAGNOSIS — M25551 Pain in right hip: Secondary | ICD-10-CM | POA: Diagnosis not present

## 2014-10-16 DIAGNOSIS — I071 Rheumatic tricuspid insufficiency: Secondary | ICD-10-CM | POA: Diagnosis not present

## 2014-10-16 DIAGNOSIS — M25552 Pain in left hip: Principal | ICD-10-CM

## 2014-10-16 DIAGNOSIS — I340 Nonrheumatic mitral (valve) insufficiency: Secondary | ICD-10-CM | POA: Diagnosis not present

## 2014-10-16 DIAGNOSIS — D509 Iron deficiency anemia, unspecified: Secondary | ICD-10-CM

## 2014-10-16 DIAGNOSIS — R55 Syncope and collapse: Secondary | ICD-10-CM | POA: Diagnosis not present

## 2014-10-16 DIAGNOSIS — I371 Nonrheumatic pulmonary valve insufficiency: Secondary | ICD-10-CM | POA: Insufficient documentation

## 2014-10-16 DIAGNOSIS — G8929 Other chronic pain: Secondary | ICD-10-CM

## 2014-10-16 LAB — CBC WITH DIFFERENTIAL/PLATELET
Basophils Absolute: 0.1 10*3/uL (ref 0.0–0.1)
Basophils Relative: 1 % (ref 0–1)
Eosinophils Absolute: 0.2 10*3/uL (ref 0.0–0.7)
Eosinophils Relative: 3 % (ref 0–5)
HCT: 33.8 % — ABNORMAL LOW (ref 39.0–52.0)
Hemoglobin: 11 g/dL — ABNORMAL LOW (ref 13.0–17.0)
Lymphocytes Relative: 34 % (ref 12–46)
Lymphs Abs: 1.9 10*3/uL (ref 0.7–4.0)
MCH: 26.1 pg (ref 26.0–34.0)
MCHC: 32.5 g/dL (ref 30.0–36.0)
MCV: 80.1 fL (ref 78.0–100.0)
MPV: 8.6 fL (ref 8.6–12.4)
Monocytes Absolute: 0.4 10*3/uL (ref 0.1–1.0)
Monocytes Relative: 7 % (ref 3–12)
Neutro Abs: 3.1 10*3/uL (ref 1.7–7.7)
Neutrophils Relative %: 55 % (ref 43–77)
Platelets: 177 10*3/uL (ref 150–400)
RBC: 4.22 MIL/uL (ref 4.22–5.81)
RDW: 16.4 % — ABNORMAL HIGH (ref 11.5–15.5)
WBC: 5.6 10*3/uL (ref 4.0–10.5)

## 2014-10-16 LAB — RETICULOCYTES
ABS Retic: 50.6 10*3/uL (ref 19.0–186.0)
RBC.: 4.22 MIL/uL (ref 4.22–5.81)
Retic Ct Pct: 1.2 % (ref 0.4–2.3)

## 2014-10-16 LAB — IRON AND TIBC
%SAT: 30 % (ref 20–55)
Iron: 94 ug/dL (ref 42–165)
TIBC: 317 ug/dL (ref 215–435)
UIBC: 223 ug/dL (ref 125–400)

## 2014-10-16 LAB — SAVE SMEAR

## 2014-10-16 LAB — FERRITIN: Ferritin: 240 ng/mL (ref 22–322)

## 2014-10-16 NOTE — Progress Notes (Signed)
  Echocardiogram 2D Echocardiogram has been performed.  Leta Jungling M 10/16/2014, 9:30 AM

## 2014-10-20 LAB — HEMOCHROMATOSIS DNA-PCR(C282Y,H63D)

## 2014-10-23 ENCOUNTER — Telehealth: Payer: Self-pay | Admitting: Internal Medicine

## 2014-10-23 NOTE — Telephone Encounter (Signed)
  Reason for call:   I placed an outgoing call to Mr. Nils Pyle, brother of [name] at 7:30 PM regarding the results of his brothers lab work. As noted to me and prior office visits, I have the patient's permission to discuss pertinent medical information with his brother.    Assessment/ Plan:   I explained the reassuring results of the echocardiogram, peripheral smear, anemia panel before disclosing that his brother was heterozygous for mutation in his iron metabolism and that he needed to follow-up with our clinic hematologist, Dr. Cyndie Chime.  He explained to me that he is currently undergoing treatment and would prefer that his sister coordinate the appointment. I attempted to reach her though was unsuccessful and will have one of the clinic staff try again next week.   Beather Arbour, MD   10/23/2014, 7:47 PM

## 2014-10-26 NOTE — Telephone Encounter (Signed)
thanks

## 2014-11-03 ENCOUNTER — Telehealth: Payer: Self-pay | Admitting: *Deleted

## 2014-11-03 NOTE — Telephone Encounter (Signed)
Pt and brother [name] walked in to clinic to check on labs.  [name] the brother that Dr Allena Katz talked to on 10/23/14 never passed the message on of lab results. Notes of phone conversation of [name] and Dr Allena Katz read to pt and [name] from 10/23/14 7:30PM. . [name] would like a copy of labs - need to discuss with Dr Allena Katz. Randie Heinz talked with pt and [name] about appt with Dr Cyndie Chime - appt 11/17/14 2:30PM. [name] would like Dr Allena Katz to call sister Stanton Kidney to discuss labs [phone]. Stanton Kidney Demitris Pokorny RN 11/03/14 9:10AM

## 2014-11-03 NOTE — Telephone Encounter (Signed)
  Reason for call:   I placed an outgoing call to Robert Bray, the sister Robert Bray of Robert Bray at 409 PM regarding the miscommunication this morning .   Assessment/ Plan:   I apologized to her about the miscommunication and explained to her that I'd told Robert Bray I would confirm with the scheduling desk before having them call Robert Bray with a confirmed appointment slot.   I reviewed the workup of his anemia with her and significance of a heterozygous mutation that her brother has. I also reviewed Dr. Cyndie Chime schedule and confirmed her brother's appointment 2 weeks from today at 2:30 PM  She wondered if he needed to have an MRI of his hip given his worsening pain. I explained to her that that is not unreasonable and will look into figuring out how it is scheduled  She also wondered if he needed repeat blood work at follow-up appointment, and I explained to her that I would leave that up to Dr. Cyndie Chime. I also explained to her signs of worsening anemia, like dyspnea, fatigue, chest pain, in which case he should present to the emergency department to which she acknowledged understanding.  She thanked me for my time and call.    Robert Arbour, MD   11/03/2014, 6:45 PM

## 2014-11-13 ENCOUNTER — Encounter: Payer: Self-pay | Admitting: Internal Medicine

## 2014-11-13 NOTE — Progress Notes (Addendum)
Patient ID: Robert Bray, male   DOB: 08-06-1955, 59 y.o.   MRN: [id number]  I am completing paperwork today from [facility] that requires my signature:  Received 11/13/14: Preauthorization for genetic testing for hemochromatosis  I felt this just was indicated given his chronic microcytic anemia with elevated serum iron and ferritin on anemia panel from 2015 with no signs of active bleeding and progressive fatigue. He tested heterozygous for the HFE mutation and will receive additional counseling from a hematologist.

## 2014-11-17 ENCOUNTER — Encounter: Payer: Commercial Managed Care - HMO | Admitting: Oncology

## 2014-12-14 ENCOUNTER — Ambulatory Visit (INDEPENDENT_AMBULATORY_CARE_PROVIDER_SITE_OTHER): Payer: Commercial Managed Care - HMO | Admitting: Oncology

## 2014-12-14 ENCOUNTER — Encounter: Payer: Self-pay | Admitting: Oncology

## 2014-12-14 DIAGNOSIS — R7989 Other specified abnormal findings of blood chemistry: Secondary | ICD-10-CM

## 2014-12-14 DIAGNOSIS — R740 Nonspecific elevation of levels of transaminase and lactic acid dehydrogenase [LDH]: Secondary | ICD-10-CM | POA: Diagnosis not present

## 2014-12-14 DIAGNOSIS — D509 Iron deficiency anemia, unspecified: Secondary | ICD-10-CM

## 2014-12-14 DIAGNOSIS — F1020 Alcohol dependence, uncomplicated: Secondary | ICD-10-CM

## 2014-12-14 DIAGNOSIS — R945 Abnormal results of liver function studies: Secondary | ICD-10-CM

## 2014-12-14 NOTE — Patient Instructions (Signed)
Schedule ultrasound elastography of liver; come to clinic for lab same day: orders put in for 10/7 in case we need time for insurance authorization Schedule follow up visit with Dr Heywood Iles for 2-3 months; Return visit with Dr Reece Agar in 1 year

## 2014-12-15 NOTE — Progress Notes (Signed)
Patient ID: Robert Bray, male   DOB: 04/15/1955, 59 y.o.   MRN: [id number] New Patient Hematology   Robert Bray [id number] 1955/03/27 59 y.o. 12/15/2014  CC: Dr. Heywood Iles   Reason for referral: Patient counseling on recently discovered heterozygote status for the C282Y hemochromatosis gene   HPI:  59 year old heavy beer drinker with previous cocaine use. He has a chronic microcytic anemia but when iron studies were checked back in March 2015 serum iron was 260, percent saturation 71%. Ferritin 316. Liver functions were normal except for mild elevation of SGOT, 52. A number of additional studies done at that time included hepatitis A, B, C, HIV, ANA, rheumatoid factor, all negative. Iron studies were repeated on 10/16/2014. Serum iron 94, percent saturation 30, ferritin 240. Persistent mild elevation of SGOT 49. Hemochromatosis gene study was done on 10/16/2014 and shows that he is a heterozygote for the C282Y gene. Of interest, his 38 year old brother who accompanies him today, recently under evaluation for iron deficiency anemia and required a parenteral iron infusion. His iron deficiency likely relates to previous resection of a significant amount of bowel. He has 2 other brothers and 4 sisters who have not been tested. His father died at age 60 of colon cancer. Mother is still alive at 60. With respect to his microcytic anemia, this dates back as far as we have records to 01/19/2010 when hemoglobin was 11.6, MCV 78. Given the iron studies above, I suspect that he has a thalassemia trait to explain his chronic microcytic anemia. He had a colonoscopy 03/10/2011 which showed multiple benign polyps which were removed (hyperplastic polyps and tubular adenomas without any malignant change).  He denies any history of hepatitis, yellow jaundice, or mononucleosis.   PMH: Past Medical History  Diagnosis Date  . Chronic low back pain 2000    Reports that he has been treated by  orthopedic surgeon, but was unable to continue to to lack of insurance. He has had steroid injections in the hip. He attributes his back pain to his job, which involved lifting heavy objects.  . History of cocaine use     UDS was positive for cocaine in 2011.  Marland Kitchen Smoking   . Alcohol abuse    no hypertension, MI, diabetes, ulcers, thyroid disease, renal disease, seizure, stroke, inflammatory arthritis.   surgical history: No prior surgery  Allergies: Allergies  Allergen Reactions  . Oxycodone Hcl Itching    Medications:  Current outpatient prescriptions:  .  diphenhydramine-acetaminophen (TYLENOL PM) 25-500 MG TABS, Take 1-2 tablets by mouth at bedtime as needed., Disp: , Rfl:  .  ibuprofen (ADVIL,MOTRIN) 400 MG tablet, Take 1 tablet (400 mg total) by mouth every 6 (six) hours as needed., Disp: 30 tablet, Rfl: 3  Social History: His wife died 5 years ago. He has no children. He is disabled secondary to chronic left leg and low back pain. 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 One half pack per day smoker. 240 ounce beers daily.  He reports that he no longer uses illicit drugs.  Family History: Family History  Problem Relation Age of Onset  . Hypertension Mother   . Hypertension Sister   . Kidney failure Mother     transplant in adult age. Unclear cause   . Breast cancer Sister   . Brain cancer Brother   . Breast cancer Sister     In remission     Review of Systems:  See history of present illness Remaining ROS negative.  Physical Exam: Blood pressure 118/71, pulse 102, temperature 97.7 F (36.5 C), temperature source Oral, height  (1.854 m), weight 156 lb (70.761 kg), SpO2 100 %. Wt Readings from Last 3 Encounters:  12/14/14 156 lb (70.761 kg)  10/02/14 163 lb 1.6 oz (73.982 kg)  05/07/14 161 lb 1.6 oz (73.074 kg)     General appearance: Tall, thin, African-American man bordering on cachectic HENNT: Pharynx no erythema, exudate, mass,  or ulcer. No thyromegaly or thyroid nodules Lymph nodes: No cervical, supraclavicular, or axillary lymphadenopathy Breasts:  Lungs: Clear to auscultation, resonant to percussion throughout Heart: Regular rhythm, no murmur, no gallop, no rub, no click, no edema Abdomen: Soft, nontender, normal bowel sounds, no mass, no organomegaly Extremities: No edema, no calf tenderness Musculoskeletal: no joint deformities GU:  Vascular: Carotid pulses 2+, no bruits, Neurologic: Alert, oriented, arcus senilis, PERRLA,  cranial nerves grossly normal, motor strength 5 over 5, reflexes absent but symmetric, upper body coordination normal, gait normal, Skin: No rash or ecchymosis    Lab Results: Lab Results  Component Value Date   WBC 5.6 10/16/2014   HGB 11.0* 10/16/2014   HCT 33.8* 10/16/2014   MCV 80.1 10/16/2014   PLT 177 10/16/2014     Chemistry      Component Value Date/Time   NA 136 10/02/2014 1522   K 4.8 10/02/2014 1522   CL 102 10/02/2014 1522   CO2 25 10/02/2014 1522   BUN 14 10/02/2014 1522   CREATININE 1.17 10/02/2014 1522   CREATININE 1.13 01/30/2014 1541      Component Value Date/Time   CALCIUM 9.6 10/02/2014 1522   ALKPHOS 76 10/02/2014 1522   AST 49* 10/02/2014 1522   ALT 41 10/02/2014 1522   BILITOT 0.4 10/02/2014 1522        Impression and Plan: #1. Heterozygote status for the C282Y hemochromatosis gene Being a heterozygote for this gene will never lead to clinical hemochromatosis unless there is concomitant liver disease. He is strongly advised to limit his alcohol consumption in this regard. Most recent ferritin level is only 240 with reference range up to 322. There is no indication for phlebotomy at this time. In view of his alcohol use and mild chronic transaminase elevation, I'm going to go ahead and schedule him for an ultrasound elastography to rule out early cirrhosis and as a baseline to rule out any liver masses. I'll repeat his lab profile when he comes  back for the ultrasound. He can be followed exclusively by his primary care physician. I would check ferritin levels every 4-6 months.  #2. Microcytic anemia Likely related to thalassemia trait. See discussion above in the history of present illness.      Levert Feinstein, MD 12/15/2014, 7:29 PM

## 2014-12-17 ENCOUNTER — Telehealth: Payer: Self-pay | Admitting: *Deleted

## 2014-12-17 NOTE — Telephone Encounter (Signed)
Pt called / informed Abd U/S w/ elastography scheduled for Oct 12@ 1100AM; needs to arrive @ 1045AM; NPO after MN - here at [facility] radiology dept. Pt voiced understanding.

## 2014-12-30 ENCOUNTER — Ambulatory Visit (HOSPITAL_COMMUNITY): Payer: Commercial Managed Care - HMO

## 2015-03-26 ENCOUNTER — Encounter: Payer: Self-pay | Admitting: Internal Medicine

## 2015-03-26 ENCOUNTER — Ambulatory Visit (INDEPENDENT_AMBULATORY_CARE_PROVIDER_SITE_OTHER): Payer: Commercial Managed Care - HMO | Admitting: Internal Medicine

## 2015-03-26 VITALS — BP 126/70 | HR 111 | Temp 97.3°F | Wt 172.1 lb

## 2015-03-26 DIAGNOSIS — IMO0002 Reserved for concepts with insufficient information to code with codable children: Secondary | ICD-10-CM

## 2015-03-26 DIAGNOSIS — Z23 Encounter for immunization: Secondary | ICD-10-CM | POA: Diagnosis not present

## 2015-03-26 DIAGNOSIS — F1020 Alcohol dependence, uncomplicated: Secondary | ICD-10-CM | POA: Diagnosis not present

## 2015-03-26 DIAGNOSIS — M160 Bilateral primary osteoarthritis of hip: Secondary | ICD-10-CM | POA: Diagnosis not present

## 2015-03-26 DIAGNOSIS — F1090 Alcohol use, unspecified, uncomplicated: Secondary | ICD-10-CM

## 2015-03-26 MED ORDER — CELECOXIB 100 MG PO CAPS: 100.0000 mg | ORAL_CAPSULE | Freq: Every day | ORAL | Status: DC

## 2015-03-26 MED ORDER — TRAMADOL HCL 50 MG PO TABS: 50.0000 mg | ORAL_TABLET | Freq: Two times a day (BID) | ORAL | Status: DC | PRN

## 2015-03-26 NOTE — Assessment & Plan Note (Addendum)
Overview He was seen by Dr. Cyndie ChimeGranfortuna on 12/15/14 after he tested heterozygous for the C2821 hemochromatosis gene. He reports his right upper quadrant ultrasound was canceled and is aware that he is due for lab work today.  Assessment Heterozygous status for the HFE gene  Plan Check CMET, CBC, iron studies today Repeat lab work in 4-6 months  ADDENDUM 03/29/2015  11:01 AM:  Ferritin and hemoglobin are stable at 236 and 11-12, respectively. AST remains mildly elevated at 49. He does have anion gap metabolic acidosis which I wonder is related to his excessive alcohol use.    Need to draw hemoglobinopathy evaluation at next visit which we missed at this visit since I did not see the future labs pended in the system

## 2015-03-26 NOTE — Assessment & Plan Note (Addendum)
Overview He feels his left hip pain is no better since when he last saw me. He spends most of his day either laying in bed or sitting in a chair because of his pain. Worse with standing up or with internal rotation of flexed leg while laying supine. Review of his chart is notable for Mobic, Tylenol, ibuprofen, gabapentin. His brother Gwynneth Munson reports physical therapy left his brother with more pain than when he started it. No falls or syncopal episodes since last visit. No changes in urine/bowel pattern as well.  Assessment Bilateral hip arthritis, worse on left versus right. Review of chart is notable for extensive history, including input from orthopedics. Not interested in joint injections.  Plan -Explained to the patient and his family that this pain cannot be completely eliminated and that the focus should be on minimizing pain to allow for ability to enjoy interests (walking, fishing) -Started celecoxib 100mg  daily with pain to up-titrate to 200mg  after 7 days if no better on starting dose -Prescribed Tramadol 50mg  x 10 tablets to be taken every 12 hours as needed for breakthrough pain with instructions that it is not to be used a maintenance medication -Plan to follow-up in 4 weeks for reassessment -To consolidate this problem, below are the entries pasted from another problem.    10/02/2014 Office Visit Edited 10/02/2014  5:47 PM by Beather Arbour, MD   Per his brother, his hip pain has been worsening over the interval since his last office visit. Per chart review, it appears did not follow-up with orthopedics because of financial reasons. I discussed with him that even if we did reimage his hip, the likely management for arthritis would entail steroid injections and physical therapy to which they acknowledged understanding. They will try to get follow-up hip x-rays the same time as his outpatient echocardiogram.    Previous Version                  05/07/2014 Office Visit Edited 05/08/2014   9:30 AM by Heywood Iles, MD   Overview -He reports a long-term history of hip pain though worse on his left specially in the setting of cold weather.   -He reports receiving steroid injections bilaterally in his hips though feels they made his pain worse.   -Movements which worsen his left hip pain include bending down to pick something up.   -He feels Flexeril and heating pad seemed to improve his pain.   -He denies any numbness or tingling, trouble passing urine or stool. Pain does not radiate down the length of his leg but rather the length of the femur from hip to knee.   -Of note, left hip MRI in 2014 showed osteoarthritic changes and tendinitis.  Assessment -Lack of neurologic symptoms as well as the absence of bowel/bladder dysfunction is reassuring for no cord compression or spinal cord involvement, especially given that there was a right lesion noted in the peripheral zone numbers prostate gland on the 2014 MRI of his left hip -Likely related to his osteoarthritis with a possible musculoskeletal component as he reports some kind of improvement with Flexeril  Plan -Order complete hip x-rays and PSA -Refer to orthopedics for further management as he is agreeable to start returning to see them again -Advised him to take ibuprofen 200-400 mg every 4-6 hours as needed for pain -Recommend he follow-up with me in 3-6 months after he has a chance to see the orthopedist  ADDENDUM 05/08/2014  9:29 AM:  -PSA 3.2, stable  from 1 year ago is reassuring.

## 2015-03-26 NOTE — Progress Notes (Signed)
   Subjective:    Patient ID: Robert Bray, male    DOB: 01/23/1956, 60 y.o.   MRN: [id number]  HPI Robert Bray is a 60 year old male with history of polysubstance abuse, chronic hip pain, iron overload syndrome who presents for hip pain with his brothers Robert Bray and Robert Bray. Please see assessment & plan for status of chronic medical problems.  He is agreeable to receiving pneumococcus and influenza vaccinations today.   Review of Systems  Respiratory: Negative for shortness of breath.   Cardiovascular: Negative for chest pain and leg swelling.  Gastrointestinal: Negative for nausea, vomiting, abdominal pain, diarrhea and blood in stool.  Musculoskeletal: Positive for back pain (L > R hip pain), arthralgias and gait problem.  Neurological: Negative for dizziness.       Objective:   Physical Exam  Constitutional: He is oriented to person, place, and time. He appears well-developed. No distress.  Thin  HENT:  Head: Normocephalic and atraumatic.  Mouth/Throat: Oropharynx is clear and moist. No oropharyngeal exudate.  Eyes: Conjunctivae are normal. Pupils are equal, round, and reactive to light. No scleral icterus.  Cardiovascular: Normal rate, regular rhythm and normal heart sounds.  Exam reveals no gallop and no friction rub.   No murmur heard. Pulmonary/Chest: Effort normal and breath sounds normal. No respiratory distress. He has no wheezes. He has no rales.  Abdominal: Soft. Bowel sounds are normal. He exhibits no distension. There is no tenderness.  Neurological: He is alert and oriented to person, place, and time.  Skin: Skin is warm and dry. He is not diaphoretic.  Psychiatric: He has a normal mood and affect. His behavior is normal.          Assessment & Plan:

## 2015-03-26 NOTE — Patient Instructions (Signed)
For your hip pain, please start taking Celebrex 100 mg daily. After one week, you may start taking 200 mg [2 tablets]. If pain is severe, you can try tramadol 50 mg every 12 hours.  I will check with Dr. Cyndie ChimeGranfortuna about your liver study. Please come back in one month to see how you're doing with your hip pain.

## 2015-03-27 LAB — CBC WITH DIFFERENTIAL/PLATELET
Basophils Absolute: 0 10*3/uL (ref 0.0–0.2)
Basos: 0 %
EOS (ABSOLUTE): 0.1 10*3/uL (ref 0.0–0.4)
Eos: 1 %
Hematocrit: 36.7 % — ABNORMAL LOW (ref 37.5–51.0)
Hemoglobin: 11.8 g/dL — ABNORMAL LOW (ref 12.6–17.7)
IMMATURE GRANULOCYTES: 0 %
Immature Grans (Abs): 0 10*3/uL (ref 0.0–0.1)
Lymphocytes Absolute: 2.8 10*3/uL (ref 0.7–3.1)
Lymphs: 38 %
MCH: 25.7 pg — ABNORMAL LOW (ref 26.6–33.0)
MCHC: 32.2 g/dL (ref 31.5–35.7)
MCV: 80 fL (ref 79–97)
MONOS ABS: 0.6 10*3/uL (ref 0.1–0.9)
Monocytes: 9 %
NEUTROS PCT: 52 %
Neutrophils Absolute: 3.7 10*3/uL (ref 1.4–7.0)
Platelets: 169 10*3/uL (ref 150–379)
RBC: 4.59 x10E6/uL (ref 4.14–5.80)
RDW: 15.2 % (ref 12.3–15.4)
WBC: 7.2 10*3/uL (ref 3.4–10.8)

## 2015-03-27 LAB — CMP14 + ANION GAP
ALK PHOS: 96 IU/L (ref 39–117)
ALT: 34 IU/L (ref 0–44)
ANION GAP: 24 mmol/L — AB (ref 10.0–18.0)
AST: 49 IU/L — AB (ref 0–40)
Albumin/Globulin Ratio: 1.4 (ref 1.1–2.5)
Albumin: 4.5 g/dL (ref 3.6–4.8)
BUN/Creatinine Ratio: 14 (ref 10–22)
BUN: 15 mg/dL (ref 8–27)
Bilirubin Total: 0.3 mg/dL (ref 0.0–1.2)
CO2: 20 mmol/L (ref 18–29)
CREATININE: 1.06 mg/dL (ref 0.76–1.27)
Calcium: 9.6 mg/dL (ref 8.6–10.2)
Chloride: 96 mmol/L (ref 96–106)
GFR calc Af Amer: 88 mL/min/{1.73_m2} (ref >59)
GFR calc non Af Amer: 76 mL/min/{1.73_m2} (ref >59)
Globulin, Total: 3.2 g/dL (ref 1.5–4.5)
Glucose: 88 mg/dL (ref 65–99)
Potassium: 5.1 mmol/L (ref 3.5–5.2)
Sodium: 140 mmol/L (ref 134–144)
Total Protein: 7.7 g/dL (ref 6.0–8.5)

## 2015-03-27 LAB — IRON AND TIBC
IRON SATURATION: 48 % (ref 15–55)
IRON: 164 ug/dL (ref 38–169)
TIBC: 343 ug/dL (ref 250–450)
UIBC: 179 ug/dL (ref 111–343)

## 2015-03-27 LAB — FERRITIN: FERRITIN: 236 ng/mL (ref 30–400)

## 2015-03-29 NOTE — Assessment & Plan Note (Signed)
Overview As part counseling received for his heterozygous hemachromatosis condition, he was advised to reduce his alcohol intake. He reports he used to drink 40 ounce bottles 10 per week prior to his appointment with Dr. Cyndie ChimeGranfortuna September the now drinks 5 bottles per week. He denies any additional falls or syncopal episodes since September.  Assessment Ongoing heavy alcohol use, mildly improved from prior.  Plan Continue advising cutting back on his alcohol intake

## 2015-03-29 NOTE — Progress Notes (Signed)
Internal Medicine Clinic Attending  Case discussed with Dr. Patel,Rushil at the time of the visit.  We reviewed the resident's history and exam and pertinent patient test results.  I agree with the assessment, diagnosis, and plan of care documented in the resident's note.  

## 2015-03-30 ENCOUNTER — Telehealth: Payer: Self-pay | Admitting: Internal Medicine

## 2015-03-30 NOTE — Telephone Encounter (Signed)
He was supposed to get a RUQ ultrasound to evaluate his liver after his last visit in September, and at his visit last week, he told me it was cancelled. After speaking with Dr. Cyndie ChimeGranfortuna, we do indeed want to get this study.   Can we please look into rescheduling? Thank you.

## 2015-04-01 NOTE — Telephone Encounter (Signed)
Robert Bray, US scheduled in sept was canceled.  What can I do to reschedule per Dr. Allena KatzPatel?

## 2015-04-22 ENCOUNTER — Other Ambulatory Visit: Payer: Self-pay | Admitting: Internal Medicine

## 2015-04-22 DIAGNOSIS — D509 Iron deficiency anemia, unspecified: Secondary | ICD-10-CM

## 2015-04-22 NOTE — Telephone Encounter (Signed)
Any updates? Patient is to see me tomorrow.

## 2015-04-23 ENCOUNTER — Encounter: Payer: Self-pay | Admitting: Internal Medicine

## 2015-04-23 ENCOUNTER — Telehealth: Payer: Self-pay | Admitting: *Deleted

## 2015-04-23 ENCOUNTER — Ambulatory Visit (INDEPENDENT_AMBULATORY_CARE_PROVIDER_SITE_OTHER): Payer: Commercial Managed Care - HMO | Admitting: Internal Medicine

## 2015-04-23 VITALS — BP 117/76 | HR 109 | Temp 97.6°F | Ht 73.0 in | Wt 170.0 lb

## 2015-04-23 DIAGNOSIS — F1090 Alcohol use, unspecified, uncomplicated: Secondary | ICD-10-CM

## 2015-04-23 DIAGNOSIS — M160 Bilateral primary osteoarthritis of hip: Secondary | ICD-10-CM | POA: Diagnosis not present

## 2015-04-23 DIAGNOSIS — F10288 Alcohol dependence with other alcohol-induced disorder: Secondary | ICD-10-CM | POA: Diagnosis not present

## 2015-04-23 DIAGNOSIS — IMO0002 Reserved for concepts with insufficient information to code with codable children: Secondary | ICD-10-CM

## 2015-04-23 NOTE — Telephone Encounter (Signed)
Patient did not pick up previous prescription for Celebrex call to [facility] on [address] [facility] prescription to redone and readied for pickup.  Angelina Ok, RN 04/23/2015 2:49 PM

## 2015-04-23 NOTE — Patient Instructions (Addendum)
Please continue working on cutting back on the alcohol to 3 bottles of 40 oz each week.  We will work on getting you that ultrasound study for your liver.   Let's see each other back in 2 months to see how the hip pain is doing.

## 2015-04-24 NOTE — Assessment & Plan Note (Signed)
Overview Since his last office visit, he continues to drink 5-6 bottles of 40 on small liquor a week. He feels his triggers for relapsing into his habit or his friend circle. His brother and him report that his hobby of building model ships helps to keep his mind off his cravings for alcohol. He denies any prior seizures or fall/syncopal episodes since his last office visit.  Assessment Ongoing alcohol use disorder with no improvement since his last office visit.  Plan -Encouraged the patient to continue making progress with regards to cutting back on his alcohol intake given the risk factors posed to his liver by iron overload syndrome -Set goal for 3 x 40 ounce bottles of malt liquor per week with the patient -Follow-up in 2 months for reassessment

## 2015-04-24 NOTE — Progress Notes (Signed)
   Subjective:    Patient ID: Robert Bray, male    DOB: 1955-06-09, 59 y.o.   MRN: [id number]  HPI Robert Bray is a 60 year old male with history of polysubstance abuse, chronic hip pain, iron overload syndrome who presents for hip pain with his brother [name]. Please see assessment & plan for status of chronic medical problems.   Review of Systems  Respiratory: Negative for shortness of breath.   Cardiovascular: Negative for chest pain and leg swelling.  Gastrointestinal: Negative for nausea, vomiting, abdominal pain, diarrhea and blood in stool.  Musculoskeletal: Positive for back pain (L > R hip pain), arthralgias and gait problem.  Neurological: Negative for dizziness.       Objective:   Physical Exam  Constitutional: He is oriented to person, place, and time. He appears well-developed. No distress.  Thin, middle-aged African-American male.  HENT:  Head: Normocephalic and atraumatic.  Mouth/Throat: Oropharynx is clear and moist. No oropharyngeal exudate.  Eyes: Conjunctivae are normal. Pupils are equal, round, and reactive to light. No scleral icterus.  Cardiovascular: Normal rate, regular rhythm and normal heart sounds.  Exam reveals no gallop and no friction rub.   No murmur heard. Pulmonary/Chest: Effort normal and breath sounds normal. No respiratory distress. He has no wheezes.  Abdominal: Soft. Bowel sounds are normal. He exhibits no distension. There is no tenderness.  Neurological: He is alert and oriented to person, place, and time.  Skin: Skin is warm and dry. He is not diaphoretic.  Psychiatric: He has a normal mood and affect. His behavior is normal.          Assessment & Plan:

## 2015-04-24 NOTE — Assessment & Plan Note (Addendum)
Overview His brother today is concerned they didn't receive results of his lab work. I explained to him that nothing concerning as noted on his lab draws in January. Due to miscommunication, he still has not had any liver imaging.  Assessment Iron overload syndrome, stable as noted by ferritin 236 last month.  Plan -Reassured patient and his family -Continue encouraging alcohol abstinence -Check hemoglobinopathy evaluation for thalassemia given the chronic microcytic anemia noted on previous lab work  ADDENDUM 05/03/2015  1:03 PM:  No thalassemia present to account for his microcytic anemia.

## 2015-04-24 NOTE — Assessment & Plan Note (Addendum)
Overview Since last time, he has been unable to pick up the tramadol and Celebrex prescribed to him. He reports continue to struggle walking and pain bilaterally, worse on his left side that's stable from last visit. He denies any radicular symptoms or bowel or bladder dysfunction. He has not had any syncopal episodes or falls since his last visit.   Assessment Bilateral hip arthritis well-established by radiographic findings. He has been treated with various NSAIDs, physical therapy, joint injections in the past with some relief. Unable to assess if his symptoms have gotten worse since he has not tried the medications which were prescribed at his last visit.  Plan -Clarified medications with the pharmacy per the patient's coverage and advised the patient to try this medication -Follow-up in 2 months

## 2015-04-26 LAB — HEMOGLOBINOPATHY EVALUATION
HEMOGLOBIN F QUANTITATION: 0 % (ref 0.0–2.0)
HGB C: 0 %
HGB S: 0 %
Hemoglobin A2 Quantitation: 2 % (ref 0.7–3.1)
Hgb A: 98 % (ref 94.0–98.0)

## 2015-04-26 NOTE — Progress Notes (Signed)
Medicine attending: Medical history, presenting problems, physical findings, and medications, reviewed with resident physician Dr Rushil Patel on the day of the patient visit and I concur with his evaluation and management plan. 

## 2015-05-03 NOTE — Addendum Note (Signed)
Addended by: Beather Arbour on: 05/03/2015 11:52 AM   Modules accepted: Orders

## 2015-05-26 ENCOUNTER — Ambulatory Visit (HOSPITAL_COMMUNITY)
Admission: RE | Admit: 2015-05-26 | Discharge: 2015-05-26 | Disposition: A | Discharge status: Home / Self Care | Payer: Commercial Managed Care - HMO | Source: Ambulatory Visit | Attending: Student in an Organized Health Care Education/Training Program | Admitting: Student in an Organized Health Care Education/Training Program

## 2015-05-26 DIAGNOSIS — R93422 Abnormal radiologic findings on diagnostic imaging of left kidney: Secondary | ICD-10-CM | POA: Diagnosis not present

## 2015-05-26 DIAGNOSIS — R932 Abnormal findings on diagnostic imaging of liver and biliary tract: Secondary | ICD-10-CM | POA: Diagnosis not present

## 2015-05-26 DIAGNOSIS — N200 Calculus of kidney: Secondary | ICD-10-CM | POA: Diagnosis not present

## 2015-06-24 ENCOUNTER — Telehealth: Payer: Self-pay | Admitting: Internal Medicine

## 2015-06-24 NOTE — Telephone Encounter (Signed)
APPT. REMINDER CALL, NO ANSWER, NO VOICE MAIL °

## 2015-06-25 ENCOUNTER — Encounter: Payer: Self-pay | Admitting: Internal Medicine

## 2015-06-25 ENCOUNTER — Ambulatory Visit (INDEPENDENT_AMBULATORY_CARE_PROVIDER_SITE_OTHER): Payer: Commercial Managed Care - HMO | Admitting: Internal Medicine

## 2015-06-25 VITALS — BP 121/60 | HR 117 | Temp 97.7°F | Ht 73.0 in | Wt 166.2 lb

## 2015-06-25 DIAGNOSIS — M160 Bilateral primary osteoarthritis of hip: Secondary | ICD-10-CM

## 2015-06-25 DIAGNOSIS — S90861A Insect bite (nonvenomous), right foot, initial encounter: Secondary | ICD-10-CM | POA: Diagnosis not present

## 2015-06-25 DIAGNOSIS — W57XXXA Bitten or stung by nonvenomous insect and other nonvenomous arthropods, initial encounter: Secondary | ICD-10-CM | POA: Diagnosis not present

## 2015-06-25 DIAGNOSIS — L298 Other pruritus: Secondary | ICD-10-CM | POA: Diagnosis not present

## 2015-06-25 DIAGNOSIS — Y92007 Garden or yard of unspecified non-institutional (private) residence as the place of occurrence of the external cause: Secondary | ICD-10-CM | POA: Diagnosis not present

## 2015-06-25 DIAGNOSIS — F1010 Alcohol abuse, uncomplicated: Secondary | ICD-10-CM

## 2015-06-25 DIAGNOSIS — K7030 Alcoholic cirrhosis of liver without ascites: Secondary | ICD-10-CM

## 2015-06-25 DIAGNOSIS — F1020 Alcohol dependence, uncomplicated: Secondary | ICD-10-CM

## 2015-06-25 MED ORDER — DIPHENHYDRAMINE-APAP (SLEEP) 25-500 MG PO TABS: 1.0000 | ORAL_TABLET | Freq: Every day | ORAL | Status: DC

## 2015-06-25 MED ORDER — DIPHENHYDRAMINE HCL 25 MG PO CAPS: 25.0000 mg | ORAL_CAPSULE | ORAL | Status: DC | PRN

## 2015-06-25 MED ORDER — CELECOXIB 100 MG PO CAPS: 100.0000 mg | ORAL_CAPSULE | Freq: Every day | ORAL | Status: DC

## 2015-06-25 MED ORDER — ACAMPROSATE CALCIUM 333 MG PO TBEC: 666.0000 mg | DELAYED_RELEASE_TABLET | Freq: Three times a day (TID) | ORAL | Status: DC

## 2015-06-25 NOTE — Patient Instructions (Signed)
For the alcohol, I will reach out to Ms. Edson SnowballShana, our Child psychotherapistsocial worker, and have prescribed acamprosate 667mg  three times daily.   Please see me back next month to see how you're doing.

## 2015-06-25 NOTE — Assessment & Plan Note (Addendum)
Overview In the last week, his brother Marina Goodellerry reports that the patient was out in the backyard was bit by an insect which caused generalized itching. Denies any fevers or other systemic symptoms like myalgias. They're wondering what they can do to relieve the pruritus which appears to be improving on its own.  Assessment Localized histaminergic reaction secondary to insect bite. No signs to suggest cellulitis on physical exam.  Plan -Prescribed Benadryl 25 mg to be taken every 4 hours as needed for generalized itching

## 2015-06-25 NOTE — Progress Notes (Signed)
   Subjective:    Patient ID: Robert Bray, male    DOB: 11/28/1955, 60 y.o.   MRN: [id number]  HPI Robert Bray is a 60 year old male with history of polysubstance abuse, chronic hip pain, iron overload syndrome who presents with his brother Robert Bray to review the results of his most recent liver imaging study. Please see assessment & plan for status of chronic medical problems.  Review of Systems  Respiratory: Negative for shortness of breath.   Cardiovascular: Negative for chest pain and leg swelling.  Gastrointestinal: Negative for nausea, vomiting, abdominal pain and diarrhea.  Musculoskeletal: Positive for back pain (L > R hip pain), arthralgias and gait problem.  Neurological: Negative for dizziness.       Objective:   Physical Exam  Constitutional: He is oriented to person, place, and time. He appears well-developed. No distress.  Thin, middle-aged African-American male.  HENT:  Head: Normocephalic and atraumatic.  Eyes: Conjunctivae and EOM are normal. No scleral icterus.  Cardiovascular: Normal rate, regular rhythm and normal heart sounds.  Exam reveals no gallop and no friction rub.   No murmur heard. Pulmonary/Chest: Effort normal and breath sounds normal. No respiratory distress. He has no wheezes.  Neurological: He is alert and oriented to person, place, and time.  Skin: Skin is warm and dry. Rash noted. He is not diaphoretic. There is erythema.  Right foot with 2 punctate areas surrounded by some erythema along side the medial dorsal surface as compared to the left foot. Nontender. No warmth as compared to the contralateral foot. Varicose veins noted bilaterally.  Psychiatric: He has a normal mood and affect. His behavior is normal.          Assessment & Plan:

## 2015-06-26 NOTE — Assessment & Plan Note (Addendum)
Overview His liver ultrasound imaging findings were reviewed with him and his brother and notable for early signs of cirrhosis. The patient notes that he had been working on cutting back on his alcohol to drink 2 cans of beer in the last week and shared a pint of liquor with his friends. He is interested in both counseling and pharmacologic measures to help him reduce his alcohol intake.  Assessment Alcohol use disorder of mild severity given his difficulty with cutting back despite counseling and continued use.  Plan -Prescribe acamprosate 667 mg 3 times daily. I explained that the main side effect of this medication which was diarrhea and would like for him to follow-up with me in one month to reassess how he is doing on this medication along with his alcohol abuse counseling. -Refer to social worker for alcohol abuse counseling

## 2015-06-26 NOTE — Assessment & Plan Note (Addendum)
Overview He feels Tylenol PM helps him sleep at night. He has also taken one tablet of tramadol 50 mg every so often which was prescribed to him back in January at bedtime. He did not pick up Celebrex 100 mg daily but would like for it to be refilled.  Assessment Bilateral hip joint arthritis with improved control of pain.   Plan -Prescribe diphenhydramine acetaminophen 25/500 mg tablets to be taken one tablet at bedtime daily since prescriptions are cheaper for him by his insurance coverage -Refilled Celebrex 100 mg daily -We will reassess his alcohol intake at follow-up visit and consider transitioning to NSAIDs if he continues to consume alcohol concomitantly with his acetaminophen given the findings of early cirrhosis on his liver imaging study..Marland Kitchen

## 2015-06-28 ENCOUNTER — Telehealth: Payer: Self-pay | Admitting: Licensed Clinical Social Worker

## 2015-06-28 DIAGNOSIS — K7460 Unspecified cirrhosis of liver: Secondary | ICD-10-CM | POA: Insufficient documentation

## 2015-06-28 NOTE — Assessment & Plan Note (Addendum)
Overview Liver ultrasound 05/26/15 notable for coarse hepatic echotexture with mildly nodular hepatic contour,nonspecific, but raising the possibility of early hepatocellular disease and high risk of fibrosis [Metavir score of F3+F4].  Assessment Early cirrhosis with risk of progression given ongoing alcohol use and iron overload syndrome. Negative for HCV/HBV/HIV as of February 2015.  Plan -Plan to follow-up liver ultrasound imaging in 6-12 months -Consider HBV vaccination at follow-up given indeterminate HbS Ab

## 2015-06-28 NOTE — Telephone Encounter (Signed)
Mr. Juanetta GoslingHawkins was referred to CSW for alcohol abuse resources/counseling.  Pt has [employer] insurance.  CSW utilizing provider directory, two providers in-network with speciality.  CSW will refer Mr. Juanetta [facility]lingHawkins to [facility] or [facility].  Pt concerned transportation may be a barrier to treatment.  [employer] does have limited medical transportation available under pt's insurance.  CSW placed called to pt.  CSW left message requesting return call. CSW provided contact hours and phone number.

## 2015-06-29 NOTE — Telephone Encounter (Signed)
CSW placed called to pt.  CSW left message requesting return call. CSW provided contact hours and phone number.  Letter mailed with resources.

## 2015-06-29 NOTE — Progress Notes (Signed)
Case discussed with Dr. Patel at the time of the visit.  We reviewed the resident's history and exam and pertinent patient test results.  I agree with the assessment, diagnosis, and plan of care documented in the resident's note. 

## 2015-06-30 NOTE — Telephone Encounter (Signed)
CSW placed called to pt.  CSW left message requesting return call. CSW provided contact hours and phone number.  CSW will sign off.  No response from patient x3, information has been mailed.

## 2015-07-23 ENCOUNTER — Ambulatory Visit: Payer: Commercial Managed Care - HMO | Admitting: Internal Medicine

## 2015-07-28 ENCOUNTER — Ambulatory Visit: Payer: Commercial Managed Care - HMO | Admitting: Internal Medicine

## 2015-08-28 ENCOUNTER — Other Ambulatory Visit: Payer: Self-pay | Admitting: Internal Medicine

## 2015-08-31 NOTE — Telephone Encounter (Signed)
I have refilled for 30-day supply and will have patient schedule an appointment for further refills.  

## 2015-08-31 NOTE — Telephone Encounter (Signed)
LVM for patient, also mailed letter.

## 2015-09-02 ENCOUNTER — Ambulatory Visit (HOSPITAL_COMMUNITY): Payer: Commercial Managed Care - HMO

## 2015-09-08 ENCOUNTER — Other Ambulatory Visit (HOSPITAL_COMMUNITY): Payer: Commercial Managed Care - HMO

## 2015-10-15 ENCOUNTER — Encounter: Payer: Self-pay | Admitting: Internal Medicine

## 2015-10-15 ENCOUNTER — Ambulatory Visit (INDEPENDENT_AMBULATORY_CARE_PROVIDER_SITE_OTHER): Payer: Commercial Managed Care - HMO | Admitting: Internal Medicine

## 2015-10-15 DIAGNOSIS — F1021 Alcohol dependence, in remission: Secondary | ICD-10-CM

## 2015-10-15 DIAGNOSIS — K7030 Alcoholic cirrhosis of liver without ascites: Secondary | ICD-10-CM

## 2015-10-15 DIAGNOSIS — M160 Bilateral primary osteoarthritis of hip: Secondary | ICD-10-CM

## 2015-10-15 DIAGNOSIS — F1010 Alcohol abuse, uncomplicated: Secondary | ICD-10-CM

## 2015-10-15 MED ORDER — CELECOXIB 100 MG PO CAPS: ORAL_CAPSULE | ORAL | 3 refills | Status: DC

## 2015-10-15 MED ORDER — DIPHENHYDRAMINE-APAP (SLEEP) 25-500 MG PO TABS: 1.0000 | ORAL_TABLET | Freq: Every evening | ORAL | 3 refills | Status: DC | PRN

## 2015-10-15 MED ORDER — ACAMPROSATE CALCIUM 333 MG PO TBEC: 666.0000 mg | DELAYED_RELEASE_TABLET | Freq: Three times a day (TID) | ORAL | 3 refills | Status: DC

## 2015-10-15 NOTE — Assessment & Plan Note (Signed)
Assessment He expresses to me today that he has quit alcohol for good. His mother confronted him last weekend, and he became emotional as he was recall the details with me so I did not pursue further questioning. He acknowledges adherence taking acamprosate 666 mg 3 times daily and did actually experiment taking the medication with alcohol and found the taste to be quite unpleasant. He denies any changes in his mood, sedation, diarrhea while on this medication though his brother is concerned about the adverse effects of long-term remaining on it.  Plan -Encouraged the patient to continue staying off alcohol and commended him for his resolution to stop -Refilled acamprosate 333 mg to be taken as 2 tablets 3 times daily

## 2015-10-15 NOTE — Assessment & Plan Note (Signed)
Assessment I reviewed with the patient and his brother the most recent ultrasound findings again to indicate that he was showing early signs of cirrhosis. To protect himself, explained to him that we would need to ensure he is immune to hepatitis A and hepatitis B. He is agreeable to undergoing immunization.  Unfortunately, it appears we are out of HBV vaccines. Out of practicality, we will start these vaccines concomitantly within the next 1-2 weeks when the supply is restocked.  Plan -Follow-up in 1-2 weeks for a nursing visit to administer HAV and HBV after which she'll need to follow-up in one month for the second HBV injection and 6 months for the third HBV injection and second HAV infection.

## 2015-10-15 NOTE — Progress Notes (Signed)
Medicine attending: Medical history, presenting problems, physical findings, and medications, reviewed with resident physician Dr Rushil Patel on the day of the patient visit and I concur with his evaluation and management plan. 

## 2015-10-15 NOTE — Progress Notes (Addendum)
CC: Alcohol dependence  HPI:  Robert Bray is a 60 y.o. male who presents today with his brother [name] for alcohol dependence. Please see assessment & plan for status of chronic medical problems.   Past Medical History:  Diagnosis Date  . Alcohol abuse   . Chronic low back pain 2000   Reports that he has been treated by orthopedic surgeon, but was unable to continue to to lack of insurance. He has had steroid injections in the hip. He attributes his back pain to his job, which involved lifting heavy objects.  . History of cocaine use    UDS was positive for cocaine in 2011.  Marland Kitchen Smoking     Review of Systems:  Please see each problem below for a pertinent review of systems.   Physical Exam:  Vitals:   10/15/15 1409  BP: 122/70  Pulse: 70  Temp: 97.6 F (36.4 C)  TempSrc: Oral  SpO2: 100%  Weight: 168 lb 11.2 oz (76.5 kg)   Constitutional: Thin, middle-aged African-American male. No distress.  HEENT: Normocephalic and atraumatic. Conjunctivae are normal.  Cardiovascular: Normal rate, regular rhythm and normal heart sounds.   Pulmonary/Chest: Effort normal. No respiratory distress. No wheezes, rales.  Abdominal: Soft. Bowel sounds are normal. No distension. No tenderness. Liver edge is palpable at 6 cm below the right costal margin though unable to recall the positioning along the clavicular line  Neurological: Alert and oriented to person, place, and time. Coordination normal.  Musculoskeletal: No difficulties with ambulation. Normal range of motion. Skin: Warm and dry. Not diaphoretic.     Assessment & Plan:   See Encounters Tab for problem based charting.  Alcohol use disorder, mild, abuse Assessment He expresses to me today that he has quit alcohol for good. His mother confronted him last weekend, and he became emotional as he was recall the details with me so I did not pursue further questioning. He acknowledges adherence taking acamprosate 666 mg 3 times  daily and did actually experiment taking the medication with alcohol and found the taste to be quite unpleasant. He denies any changes in his mood, sedation, diarrhea while on this medication though his brother is concerned about the adverse effects of long-term remaining on it.  Plan -Encouraged the patient to continue staying off alcohol and commended him for his resolution to stop -Refilled acamprosate 333 mg to be taken as 2 tablets 3 times daily  Cirrhosis of liver (HCC) Assessment I reviewed with the patient and his brother the most recent ultrasound findings again to indicate that he was showing early signs of cirrhosis. To protect himself, explained to him that we would need to ensure he is immune to hepatitis A and hepatitis B. He is agreeable to undergoing immunization.  Unfortunately, it appears we are out of HBV vaccines. Out of practicality, we will start these vaccines concomitantly within the next 1-2 weeks when the supply is restocked.  Plan -Follow-up in 1-2 weeks for a nursing visit to administer HAV and HBV after which she'll need to follow-up in one month for the second HBV injection and 6 months for the third HBV injection and second HAV infection.  Bilateral hip joint arthritis Assessment He is unable to recall the name of the medication which she takes once daily though by process of elimination I believe it is celecoxib since he calls it a capsule. He finds that this medication has alleviated much of his joint pains which are limiting his day-to-day activities. He  started taking naproxen 1 tablet at bedtime because he was told by the pharmacist that he should not be taking Tylenol PM given his liver disease.  The patient's brother became very upset with the patient that he was following advice outside of that provided by his regular doctor. I explained to the patient and his brother that it is not ideal to take 2 NSAIDs together as it could lead to GI side effects, which he  denies experiencing.  Plan -Discontinued naproxen -Prescribed diphenhydramine/acetaminophen 25/500 mg tablets to be taken once at bedtime as needed -Counseled and advised the patient that one tablet of Tylenol will not lead to acute liver failure so long as he continues to remain abstinent from alcohol and does not take an excess of 3000 mg in a short period of time to which he expressed understanding   Patient discussed with Dr. Cyndie Chime

## 2015-10-15 NOTE — Patient Instructions (Addendum)
For the joint pains, please take Celebrex 1 tablet daily. Do NOT mix with Advil, Aleve, or other medicines called NSAIDs.  Please come back next week to start the injections.  For the liver, please come back in November.

## 2015-10-15 NOTE — Assessment & Plan Note (Addendum)
Assessment He is unable to recall the name of the medication which she takes once daily though by process of elimination I believe it is celecoxib since he calls it a capsule. He finds that this medication has alleviated much of his joint pains which are limiting his day-to-day activities. He started taking naproxen 1 tablet at bedtime because he was told by the pharmacist that he should not be taking Tylenol PM given his liver disease.  The patient's brother became very upset with the patient that he was following advice outside of that provided by his regular doctor. I explained to the patient and his brother that it is not ideal to take 2 NSAIDs together as it could lead to GI side effects, which he denies experiencing.  Plan -Discontinued naproxen -Prescribed diphenhydramine/acetaminophen 25/500 mg tablets to be taken once at bedtime as needed -Counseled and advised the patient that one tablet of Tylenol will not lead to acute liver failure so long as he continues to remain abstinent from alcohol and does not take an excess of 3000 mg in a short period of time to which he expressed understanding

## 2015-12-21 ENCOUNTER — Ambulatory Visit: Payer: Commercial Managed Care - HMO | Admitting: Oncology

## 2015-12-21 ENCOUNTER — Encounter: Payer: Self-pay | Admitting: Oncology

## 2016-02-04 ENCOUNTER — Ambulatory Visit (HOSPITAL_COMMUNITY)
Admission: RE | Admit: 2016-02-04 | Discharge: 2016-02-04 | Disposition: A | Discharge status: Home / Self Care | Payer: Commercial Managed Care - HMO | Source: Ambulatory Visit | Attending: Internal Medicine | Admitting: Internal Medicine

## 2016-02-04 ENCOUNTER — Ambulatory Visit (INDEPENDENT_AMBULATORY_CARE_PROVIDER_SITE_OTHER): Payer: Commercial Managed Care - HMO | Admitting: Internal Medicine

## 2016-02-04 ENCOUNTER — Encounter: Payer: Self-pay | Admitting: Internal Medicine

## 2016-02-04 VITALS — BP 118/78 | HR 84 | Temp 97.7°F | Ht 71.0 in | Wt 158.5 lb

## 2016-02-04 DIAGNOSIS — K7460 Unspecified cirrhosis of liver: Secondary | ICD-10-CM | POA: Diagnosis not present

## 2016-02-04 DIAGNOSIS — Z23 Encounter for immunization: Secondary | ICD-10-CM

## 2016-02-04 DIAGNOSIS — K0889 Other specified disorders of teeth and supporting structures: Secondary | ICD-10-CM

## 2016-02-04 DIAGNOSIS — S86002A Unspecified injury of left Achilles tendon, initial encounter: Secondary | ICD-10-CM

## 2016-02-04 DIAGNOSIS — X58XXXA Exposure to other specified factors, initial encounter: Secondary | ICD-10-CM | POA: Diagnosis not present

## 2016-02-04 DIAGNOSIS — D649 Anemia, unspecified: Secondary | ICD-10-CM | POA: Insufficient documentation

## 2016-02-04 DIAGNOSIS — F1010 Alcohol abuse, uncomplicated: Secondary | ICD-10-CM

## 2016-02-04 DIAGNOSIS — S86009A Unspecified injury of unspecified Achilles tendon, initial encounter: Secondary | ICD-10-CM | POA: Insufficient documentation

## 2016-02-04 DIAGNOSIS — M79672 Pain in left foot: Secondary | ICD-10-CM

## 2016-02-04 DIAGNOSIS — M7989 Other specified soft tissue disorders: Secondary | ICD-10-CM

## 2016-02-04 DIAGNOSIS — D509 Iron deficiency anemia, unspecified: Secondary | ICD-10-CM | POA: Diagnosis not present

## 2016-02-04 DIAGNOSIS — Z800 Family history of malignant neoplasm of digestive organs: Secondary | ICD-10-CM

## 2016-02-04 DIAGNOSIS — K7030 Alcoholic cirrhosis of liver without ascites: Secondary | ICD-10-CM

## 2016-02-04 MED ORDER — IBUPROFEN 800 MG PO TABS: 800.0000 mg | ORAL_TABLET | Freq: Three times a day (TID) | ORAL | 0 refills | Status: DC | PRN

## 2016-02-04 NOTE — Patient Instructions (Addendum)
Keep up the good work with the alcohol!  For the pain, try ibuprofen 800 every 8 hours for the pain. Let's work on getting the foot XR as well.  Come back in 1 month for your next hepatitis shot.

## 2016-02-04 NOTE — Progress Notes (Signed)
   CC: left foot pain  HPI:  [name] is a 60 y.o. male who presents today for left foot pain. Please see assessment & plan for status of chronic medical problems.   Past Medical History:  Diagnosis Date  . Alcohol abuse   . Chronic low back pain 2000   Reports that he has been treated by orthopedic surgeon, but was unable to continue to to lack of insurance. He has had steroid injections in the hip. He attributes his back pain to his job, which involved lifting heavy objects.  . History of cocaine use    UDS was positive for cocaine in 2011.  Marland Kitchen. Smoking     Review of Systems:  Please see each problem below for a pertinent review of systems.  Physical Exam:  Vitals:   02/04/16 1352  BP: 118/78  Pulse: 84  Temp: 97.7 F (36.5 C)  TempSrc: Oral  SpO2: 100%  Weight: 158 lb 8 oz (71.9 kg)  Height: 5\' 11"  (1.803 m)   Physical Exam  Constitutional: No distress.  HENT:  Head: Normocephalic and atraumatic.  Overall poor dentition noted throughout the oropharynx.  Eyes: Conjunctivae are normal. No scleral icterus.  Cardiovascular: Normal rate and regular rhythm.   Pulmonary/Chest: Effort normal. No respiratory distress.  Musculoskeletal:  Left foot appears more swollen as compared to the right.  Skin: He is not diaphoretic.     Assessment & Plan:   See Encounters Tab for problem based charting.  Patient seen with Dr. Criselda PeachesMullen

## 2016-02-05 LAB — CBC
HEMOGLOBIN: 11.3 g/dL — AB (ref 12.6–17.7)
Hematocrit: 35.2 % — ABNORMAL LOW (ref 37.5–51.0)
MCH: 25.8 pg — AB (ref 26.6–33.0)
MCHC: 32.1 g/dL (ref 31.5–35.7)
MCV: 80 fL (ref 79–97)
PLATELETS: 180 10*3/uL (ref 150–379)
RBC: 4.38 x10E6/uL (ref 4.14–5.80)
RDW: 15.5 % — ABNORMAL HIGH (ref 12.3–15.4)
WBC: 9.5 10*3/uL (ref 3.4–10.8)

## 2016-02-05 LAB — FERRITIN: Ferritin: 296 ng/mL (ref 30–400)

## 2016-02-05 LAB — SEDIMENTATION RATE: Sed Rate: 16 mm/hr (ref 0–30)

## 2016-02-07 ENCOUNTER — Encounter: Payer: Self-pay | Admitting: Internal Medicine

## 2016-02-07 NOTE — Assessment & Plan Note (Signed)
  Assessment In light of his 10 pound weight loss since his last office visit and his subjective fatigue, I'm inclined to recheck CBC and ferritin today. He has received a letter for a repeat colonoscopy which would be necessary in the event he continues to have unexplained anemia. His father in his early 2050s as result of colorectal cancer.  Plan -Check CBC, ferritin  ADDENDUM 02/07/2016  5:57 PM:  CBC with normocytic anemia given hemoglobin 11, MCV 80. RDW elevated at 15.5% which is somewhat suggestive of a variation of RBC morphology. Ferritin 296, up from 236 at 10 months ago which is not consistent with iron deficiency. Still, I cannot account for why he has had a 1 point annual drop in hemoglobin since 2014.  Nevertheless, I will refer him to GI for urgent colonoscopy given his unexplained anemia. I relayed this message to his brother Marina Goodellerry who would like for us to call him or his sister Stanton KidneyDebra for the appointment.

## 2016-02-07 NOTE — Assessment & Plan Note (Addendum)
Assessment He is agreeable to restarting his HBV, HAV immunizations as well as receiving his flu vaccine today.  Plan -Administer HBV vaccine today with plan to follow-up in one month and 6 months for remaining 2 injections -Administer HAV vaccine today with plan to follow-up in 6 months for remaining injection -Administer flu vaccine today

## 2016-02-07 NOTE — Assessment & Plan Note (Signed)
Assessment In the last month, he started having a loose tooth in the front of his mouth. As result, he feels he has lost 10 pounds and also feels more sleepy than normal.  Plan -Refer to dentistry. He has private insurance and will need to contact his provider to see who is in network.  ADDENDUM 02/07/2016  6:14 PM:  I spoke to his brother Marina Goodellerry who told me his sister Stanton KidneyDebra will look into who is in their insurance network.
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
## 2016-02-08 NOTE — Addendum Note (Signed)
Addended by: Beather ArbourPATEL, RUSHIL V on: 02/08/2016 05:31 PM   Modules accepted: Orders

## 2016-02-11 NOTE — Progress Notes (Signed)
Internal Medicine Clinic Attending  I saw and evaluated the patient.  I personally confirmed the key portions of the history and exam documented by Dr. Patel,Rushil and I reviewed pertinent patient test results.  The assessment, diagnosis, and plan were formulated together and I agree with the documentation in the resident's note. 

## 2016-02-24 DIAGNOSIS — D508 Other iron deficiency anemias: Secondary | ICD-10-CM | POA: Diagnosis not present

## 2016-02-24 DIAGNOSIS — R634 Abnormal weight loss: Secondary | ICD-10-CM | POA: Diagnosis not present

## 2016-02-24 DIAGNOSIS — Z1211 Encounter for screening for malignant neoplasm of colon: Secondary | ICD-10-CM | POA: Diagnosis not present

## 2016-03-03 ENCOUNTER — Telehealth: Payer: Self-pay | Admitting: Internal Medicine

## 2016-03-03 NOTE — Telephone Encounter (Signed)
APT. REMINDER CALL, LMTCB °

## 2016-03-06 ENCOUNTER — Ambulatory Visit (INDEPENDENT_AMBULATORY_CARE_PROVIDER_SITE_OTHER): Payer: Commercial Managed Care - HMO | Admitting: *Deleted

## 2016-03-06 ENCOUNTER — Encounter (INDEPENDENT_AMBULATORY_CARE_PROVIDER_SITE_OTHER): Payer: Self-pay

## 2016-03-06 DIAGNOSIS — Z23 Encounter for immunization: Secondary | ICD-10-CM | POA: Diagnosis not present

## 2016-03-29 DIAGNOSIS — K2950 Unspecified chronic gastritis without bleeding: Secondary | ICD-10-CM | POA: Diagnosis not present

## 2016-03-29 DIAGNOSIS — K3189 Other diseases of stomach and duodenum: Secondary | ICD-10-CM | POA: Diagnosis not present

## 2016-03-29 DIAGNOSIS — B9681 Helicobacter pylori [H. pylori] as the cause of diseases classified elsewhere: Secondary | ICD-10-CM | POA: Diagnosis not present

## 2016-03-29 DIAGNOSIS — F1010 Alcohol abuse, uncomplicated: Secondary | ICD-10-CM | POA: Diagnosis not present

## 2016-03-29 DIAGNOSIS — R69 Illness, unspecified: Secondary | ICD-10-CM | POA: Diagnosis not present

## 2016-03-29 DIAGNOSIS — R634 Abnormal weight loss: Secondary | ICD-10-CM | POA: Diagnosis not present

## 2016-03-29 DIAGNOSIS — K259 Gastric ulcer, unspecified as acute or chronic, without hemorrhage or perforation: Secondary | ICD-10-CM | POA: Diagnosis not present

## 2016-03-29 DIAGNOSIS — K269 Duodenal ulcer, unspecified as acute or chronic, without hemorrhage or perforation: Secondary | ICD-10-CM | POA: Diagnosis not present

## 2016-03-29 DIAGNOSIS — Z1211 Encounter for screening for malignant neoplasm of colon: Secondary | ICD-10-CM | POA: Diagnosis not present

## 2016-05-01 ENCOUNTER — Encounter: Payer: Self-pay | Admitting: Internal Medicine

## 2016-05-01 DIAGNOSIS — B9681 Helicobacter pylori [H. pylori] as the cause of diseases classified elsewhere: Secondary | ICD-10-CM | POA: Insufficient documentation

## 2016-05-01 DIAGNOSIS — K2950 Unspecified chronic gastritis without bleeding: Secondary | ICD-10-CM

## 2016-05-05 ENCOUNTER — Ambulatory Visit (INDEPENDENT_AMBULATORY_CARE_PROVIDER_SITE_OTHER): Payer: Medicare HMO | Admitting: Internal Medicine

## 2016-05-05 ENCOUNTER — Encounter: Payer: Self-pay | Admitting: Internal Medicine

## 2016-05-05 ENCOUNTER — Encounter (INDEPENDENT_AMBULATORY_CARE_PROVIDER_SITE_OTHER): Payer: Self-pay

## 2016-05-05 VITALS — BP 118/89 | HR 88 | Temp 97.9°F | Ht 71.0 in | Wt 174.4 lb

## 2016-05-05 DIAGNOSIS — D649 Anemia, unspecified: Secondary | ICD-10-CM | POA: Diagnosis not present

## 2016-05-05 DIAGNOSIS — M160 Bilateral primary osteoarthritis of hip: Secondary | ICD-10-CM | POA: Diagnosis not present

## 2016-05-05 DIAGNOSIS — F17210 Nicotine dependence, cigarettes, uncomplicated: Secondary | ICD-10-CM

## 2016-05-05 DIAGNOSIS — K2950 Unspecified chronic gastritis without bleeding: Secondary | ICD-10-CM | POA: Diagnosis not present

## 2016-05-05 DIAGNOSIS — F1020 Alcohol dependence, uncomplicated: Secondary | ICD-10-CM | POA: Diagnosis not present

## 2016-05-05 DIAGNOSIS — B9681 Helicobacter pylori [H. pylori] as the cause of diseases classified elsewhere: Secondary | ICD-10-CM

## 2016-05-05 DIAGNOSIS — F1010 Alcohol abuse, uncomplicated: Secondary | ICD-10-CM

## 2016-05-05 MED ORDER — ACAMPROSATE CALCIUM 333 MG PO TBEC: 666.0000 mg | DELAYED_RELEASE_TABLET | Freq: Three times a day (TID) | ORAL | 3 refills | Status: DC

## 2016-05-05 MED ORDER — OMEPRAZOLE 20 MG PO CPDR: 20.0000 mg | DELAYED_RELEASE_CAPSULE | Freq: Every day | ORAL | 3 refills | Status: DC

## 2016-05-05 NOTE — Assessment & Plan Note (Signed)
Assessment He appreciates the acamprosate and would like a refill. Since his last office visit with me, he consumed alcohol once and knows he needs to stay away from it.  Plan -Refill acamprosate 667 mg to be taken three times daily

## 2016-05-05 NOTE — Assessment & Plan Note (Addendum)
Assessment As his MCV has increased from the upper 70s to the low 80s, his Hb persists 11-12. His colonoscopy was inadequate and will have follow-up study in November, but his GI doctor felt there were no concerning lesions on her exam.   Over the last 1.5 years, I have worked up his normocytic anemia with smear, CBC & differential, iron studies and conclude it is likely from early-stage cirrhosis, malnutrition, ongoing tobacco/alcohol use. I wonder if his reduction in alcohol and tobacco may have improved the MCV.  Plan -Repeat CBC & differential, peripheral smear, retic count in 3 months when he returns for his final HBV and HAV vaccinations  ADDENDUM 08/29/2016  10:33 AM:  He is again microcytic with MCV 78, Hb 10.9 though at baseline 10-12 over the last year. Retic count stable at 1.2%. As his clinical condition is unchanged, I do not suspect anything else is at play. I will review smear with Dr. Reece AgarG as well.  Review of the peripheral smear was limited as it was damaged when removed from the envelope. From the biggest fragment, no polychromasia was noted. Mature neutrophils and lymphocytes were noted. No schistocytes.   Overall, these results are reassuring for no other causes of his chronic anemia.

## 2016-05-05 NOTE — Assessment & Plan Note (Addendum)
Assessment He underwent upper endoscopy 03/29/16 for which biopsy noted H. Pylori gastritis and would like a refill of omeprazole. He completed a course of sixteen pills which I presume are the antibiotics, and he acknowledges follow-up with his GI doctor. He was instructed to abstain from alcohol, tobacco, NSAIDs, and acetaminophen, and his brother Harrold Donathathan is wondering if he needs to on tramadol for his chronic hip arthritis. He takes acetaminophen/diphenhydramine 1 tablet at bedtime 3-4 nights/week and requires no acetaminophen during the day.   Plan -Discontinue ibuprofen and recommended against it until his gastritis had resolved -Counseled the patient and his brother that his current dose is appropriate. I warned him about the effects of long-term tramadol therapy, including but not limited to tolerance and ineffectiveness with treating long-term analgesia. I recommended we revisit analgesia if he is finding he needs increasing doses of anti-inflammatory therapy during the day. -Explained that so long as he remains abstinent from alcohol, he can tolerate acetaminophen up to 2 mg/day for short courses if needed though we want to preserve as much hepatic function as possible

## 2016-05-05 NOTE — Progress Notes (Signed)
   CC: alcohol use disorder  HPI:  [name] is a [age] y.o. male who presents today with his brother Robert Bray for alcohol use disorder. Please see assessment & plan for status of chronic medical problems.   Past Medical History:  Diagnosis Date  . Alcohol abuse   . Chronic low back pain 2000   Reports that he has been treated by orthopedic surgeon, but was unable to continue to to lack of insurance. He has had steroid injections in the hip. He attributes his back pain to his job, which involved lifting heavy objects.  . History of cocaine use    UDS was positive for cocaine in 2011.  Marland Kitchen. Smoking     Review of Systems:  Please see each problem below for a pertinent review of systems.  Physical Exam:  Vitals:   05/05/16 1316  BP: 118/89  Pulse: 88  Temp: 97.9 F (36.6 C)  TempSrc: Oral  Weight: 174 lb 6.4 oz (79.1 kg)   Physical Exam  Constitutional: He is oriented to person, place, and time. No distress.  HENT:  Head: Normocephalic and atraumatic.  Eyes: Conjunctivae are normal. Left eye exhibits no discharge.  Cardiovascular: Normal rate and regular rhythm.   Pulmonary/Chest: Effort normal. No respiratory distress.  Neurological: He is alert and oriented to person, place, and time.  Skin: Skin is warm and dry. He is not diaphoretic.    Assessment & Plan:   See Encounters Tab for problem based charting.  Patient discussed with Dr. Rogelia Bray

## 2016-05-05 NOTE — Patient Instructions (Signed)
Let's see each other back in May for your next vaccination.   Keep up the good work with cutting the smoking and alcohol!

## 2016-05-05 NOTE — Assessment & Plan Note (Addendum)
Assessment He underwent upper endoscopy 03/29/16 for which biopsy noted H. Pylori gastritis and would like a refill of omeprazole.   Plan -Refill omeprazole 20 mg

## 2016-05-08 NOTE — Progress Notes (Signed)
Internal Medicine Clinic Attending  Case discussed with Dr. Patel,Rushil soon after the resident saw the patient.  We reviewed the resident's history and exam and pertinent patient test results.  I agree with the assessment, diagnosis, and plan of care documented in the resident's note. 

## 2016-05-30 DIAGNOSIS — Z1211 Encounter for screening for malignant neoplasm of colon: Secondary | ICD-10-CM | POA: Diagnosis not present

## 2016-05-30 DIAGNOSIS — R634 Abnormal weight loss: Secondary | ICD-10-CM | POA: Diagnosis not present

## 2016-05-30 DIAGNOSIS — D508 Other iron deficiency anemias: Secondary | ICD-10-CM | POA: Diagnosis not present

## 2016-05-30 DIAGNOSIS — B9681 Helicobacter pylori [H. pylori] as the cause of diseases classified elsewhere: Secondary | ICD-10-CM | POA: Diagnosis not present

## 2016-08-02 ENCOUNTER — Ambulatory Visit (INDEPENDENT_AMBULATORY_CARE_PROVIDER_SITE_OTHER): Payer: Medicare HMO | Admitting: Internal Medicine

## 2016-08-02 ENCOUNTER — Encounter (INDEPENDENT_AMBULATORY_CARE_PROVIDER_SITE_OTHER): Payer: Self-pay

## 2016-08-02 ENCOUNTER — Encounter: Payer: Self-pay | Admitting: Internal Medicine

## 2016-08-02 VITALS — BP 106/58 | HR 82 | Temp 97.5°F | Ht 71.0 in | Wt 172.9 lb

## 2016-08-02 DIAGNOSIS — F17210 Nicotine dependence, cigarettes, uncomplicated: Secondary | ICD-10-CM

## 2016-08-02 DIAGNOSIS — Z9681 Presence of artificial skin: Secondary | ICD-10-CM | POA: Diagnosis not present

## 2016-08-02 DIAGNOSIS — J302 Other seasonal allergic rhinitis: Secondary | ICD-10-CM

## 2016-08-02 DIAGNOSIS — Z23 Encounter for immunization: Secondary | ICD-10-CM | POA: Diagnosis not present

## 2016-08-02 DIAGNOSIS — B9681 Helicobacter pylori [H. pylori] as the cause of diseases classified elsewhere: Secondary | ICD-10-CM

## 2016-08-02 DIAGNOSIS — K2950 Unspecified chronic gastritis without bleeding: Secondary | ICD-10-CM

## 2016-08-02 DIAGNOSIS — D649 Anemia, unspecified: Secondary | ICD-10-CM | POA: Diagnosis not present

## 2016-08-02 DIAGNOSIS — K7030 Alcoholic cirrhosis of liver without ascites: Secondary | ICD-10-CM

## 2016-08-02 DIAGNOSIS — Z79899 Other long term (current) drug therapy: Secondary | ICD-10-CM | POA: Diagnosis not present

## 2016-08-02 DIAGNOSIS — K7460 Unspecified cirrhosis of liver: Secondary | ICD-10-CM | POA: Diagnosis not present

## 2016-08-02 LAB — SAVE SMEAR

## 2016-08-02 MED ORDER — OMEPRAZOLE 20 MG PO CPDR: 20.0000 mg | DELAYED_RELEASE_CAPSULE | Freq: Every day | ORAL | 3 refills | Status: AC

## 2016-08-02 MED ORDER — CETIRIZINE HCL 10 MG PO TABS: 10.0000 mg | ORAL_TABLET | Freq: Every day | ORAL | 3 refills | Status: DC

## 2016-08-02 NOTE — Assessment & Plan Note (Signed)
Patient receiving vaccination for hepatitis A virus and hepatitis B virus.

## 2016-08-02 NOTE — Assessment & Plan Note (Signed)
Patient with normocytic anemia. Being worked up by Dr. Allena KatzPatel. I will follow his requests for lab work at this visit. -- CBC with differential -- Reticulocyte count -- Safe peripheral smear

## 2016-08-02 NOTE — Progress Notes (Signed)
   CC: Follow-up for anemia HPI: [name] is a 61 y.o. male with a past medical history as listed below who presents for follow-up of anemia.   Past Medical History:  Diagnosis Date  . Alcohol abuse   . Chronic low back pain 2000   Reports that he has been treated by orthopedic surgeon, but was unable to continue to to lack of insurance. He has had steroid injections in the hip. He attributes his back pain to his job, which involved lifting heavy objects.  . History of cocaine use    UDS was positive for cocaine in 2011.  Marland Kitchen. Smoking      Review of Systems: Denies chest pain and shortness of breath. Denies polyuria and constipation.  Physical Exam: Vitals:   08/02/16 1324  BP: (!) 106/58  Pulse: 82  Temp: 97.5 F (36.4 C)  TempSrc: Oral  SpO2: 96%  Weight: 172 lb 14.4 oz (78.4 kg)  Height: 5\' 11"  (1.803 m)   General appearance: alert and cooperative Head: Normocephalic, without obvious abnormality, atraumatic Lungs: clear to auscultation bilaterally Heart: regular rate and rhythm, S1, S2 normal, no murmur, click, rub or gallop Abdomen: soft, non-tender; bowel sounds normal; no masses,  no organomegaly Extremities: extremities normal, atraumatic, no cyanosis or edema  Assessment & Plan:  See encounters tab for problem based medical decision making. Patient discussed with Dr. Criselda PeachesMullen  Signed: Thomasene Lotaylor, Robert Mehlhoff, MD 08/02/2016, 1:53 PM  Pager: [phone]

## 2016-08-02 NOTE — Assessment & Plan Note (Addendum)
Patient with one month history of cough, conjunctivitis with pruritus and allergic rhinitis type symptoms. Not currently on antihistamine. We'll start cetirizine 10 mg once daily. When the pollen and other exposures have subsided following the spring season I would discontinue the cetirizine and see if the patient continues to have allergic rhinitis type symptoms. -- Cetirizine 10 mg once daily

## 2016-08-02 NOTE — Assessment & Plan Note (Signed)
Patient requesting a refill of his proton pump inhibitor. This will be provided. -- Refill omeprazole 20 mg

## 2016-08-02 NOTE — Patient Instructions (Signed)
It was a pleasure seeing you today. [facility]   Today we have given you a vaccine for hepatitis A and hepatitis B virus.  I have refilled the medications you have requested.  For your seasonal allergies I have started a medication called cetirizine. Please take 1 pill once daily.  Please return to clinic as needed or as scheduled by Dr. Allena KatzPatel. Allergies, Adult An allergy is when your body's defense system (immune system) overreacts to an otherwise harmless substance (allergen) that you breathe in or eat or something that touches your skin. When you come into contact with something that you are allergic to, your immune system produces certain proteins (antibodies). These proteins cause cells to release chemicals (histamines) that trigger the symptoms of an allergic reaction. Allergies often affect the nasal passages (allergic rhinitis), eyes (allergic conjunctivitis), skin (atopic dermatitis), and stomach. Allergies can be mild or severe. Allergies cannot spread from person to person (are not contagious). They can develop at any age and may be outgrown. What are the causes? Allergies can be caused by any substance that your immune system mistakenly targets as harmful. These may include:  Outdoor allergens, such as pollen, grass, weeds, car exhaust, and mold spores.  Indoor allergens, such as dust, smoke, mold, and pet dander.  Foods, especially peanuts, milk, eggs, fish, shellfish, soy, nuts, and wheat.  Medicines, such as penicillin.  Skin irritants, such as detergents, chemicals, and latex.  Perfume.  Insect bites or stings. What increases the risk? You may be at greater risk of allergies if other people in your family have allergies. What are the signs or symptoms? Symptoms depend on what type of allergy you have. They may include:  Runny, stuffy nose.  Sneezing.  Itchy mouth, ears, or throat.  Postnasal drip.  Sore  throat.  Itchy, red, watery, or puffy eyes.  Skin rash or hives.  Stomach pain.  Vomiting.  Diarrhea.  Bloating.  Wheezing or coughing. People with a severe allergy to food, medicine, or an insect bite may have a life-threatening allergic reaction (anaphylaxis). Symptoms of anaphylaxis include:  Hives.  Itching.  Flushed face.  Swollen lips, tongue, or mouth.  Tight or swollen throat.  Chest pain or tightness in the chest.  Trouble breathing or shortness of breath.  Rapid heartbeat.  Dizziness or fainting.  Vomiting.  Diarrhea.  Pain in the abdomen. How is this diagnosed? This condition is diagnosed based on:  Your symptoms.  Your family and medical history.  A physical exam. You may need to see a health care provider who specializes in treating allergies (allergist). You may also have tests, including:  Skin tests to see which allergens are causing your symptoms, such as:  Skin prick test. In this test, your skin is pricked with a tiny needle and exposed to small amounts of possible allergens to see if your skin reacts.  Intradermal skin test. In this test, a small amount of allergen is injected under your skin to see if your skin reacts.  Patch test. In this test, a small amount of allergen is placed on your skin and then your skin is covered with a bandage. Your health care provider will check your skin after a couple of days to see if a rash has developed.  Blood tests.  Challenges tests. In this test, you inhale a small amount of allergen by mouth to see if you have an allergic reaction. You may also be asked to:  Keep a food diary. A food diary is a record of all the foods and drinks you have in a day and any symptoms you experience.  Practice an elimination diet. An elimination diet involves eliminating specific foods from your diet and then adding them back in one by one to find out if a certain food causes an allergic reaction. How is this  treated? Treatment for allergies depends on your symptoms. Treatment may include:  Cold compresses to soothe itching and swelling.  Eye drops.  Nasal sprays.  Using a saline spray or container (neti pot) to flush out the nose (nasal irrigation). These methods can help clear away mucus and keep the nasal passages moist.  Using a humidifier.  Oral antihistamines or other medicines to block allergic reaction and inflammation.  Skin creams to treat rashes or itching.  Diet changes to eliminate food allergy triggers.  Repeated exposure to tiny amounts of allergens to build up a tolerance and prevent future allergic reactions (immunotherapy). These include:  Allergy shots.  Oral treatment. This involves taking small doses of an allergen under the tongue (sublingual immunotherapy).  Emergency epinephrine injection (auto-injector) in case of an allergic emergency. This is a self-injectable, pre-measured medicine that must be given within the first few minutes of a serious allergic reaction. Follow these instructions at home:  Avoid known allergens whenever possible.  If you suffer from airborne allergens, wash out your nose daily. You can do this with a saline spray or a neti pot to flush out your nose (nasal irrigation).  Take over-the-counter and prescription medicines only as told by your health care provider.  Keep all follow-up visits as told by your health care provider. This is important.  If you are at risk of a severe allergic reaction (anaphylaxis), keep your auto-injector with you at all times.  If you have ever had anaphylaxis, wear a medical alert bracelet or necklace that states you have a severe allergy. Contact a health care provider if:  Your symptoms do not improve with treatment. Get help right away if:  You have symptoms of anaphylaxis, such as:  Swollen mouth, tongue, or throat.  Pain or tightness in your chest.  Trouble breathing or shortness of  breath.  Dizziness or fainting.  Severe abdominal pain, vomiting, or diarrhea. This information is not intended to replace advice given to you by your health care provider. Make sure you discuss any questions you have with your health care provider. Document Released: 05/30/2002 Document Revised: 11/04/2015 Document Reviewed: 09/22/2015 Elsevier Interactive Patient Education  2017 [employer].

## 2016-08-02 NOTE — Assessment & Plan Note (Signed)
Patient receiving vaccination for hepatitis A and hepatitis B virus. This is in accordance with Dr. Eliane DecreePatel's notes. -- Hepatitis A virus vaccination -- Hepatitis B virus vaccination

## 2016-08-03 LAB — CBC WITH DIFFERENTIAL/PLATELET
BASOS: 0 %
Basophils Absolute: 0 10*3/uL (ref 0.0–0.2)
EOS (ABSOLUTE): 0.1 10*3/uL (ref 0.0–0.4)
Eos: 1 %
Hematocrit: 34.5 % — ABNORMAL LOW (ref 37.5–51.0)
Hemoglobin: 10.9 g/dL — ABNORMAL LOW (ref 13.0–17.7)
IMMATURE GRANS (ABS): 0 10*3/uL (ref 0.0–0.1)
IMMATURE GRANULOCYTES: 0 %
LYMPHS: 41 %
Lymphocytes Absolute: 3 10*3/uL (ref 0.7–3.1)
MCH: 24.7 pg — ABNORMAL LOW (ref 26.6–33.0)
MCHC: 31.6 g/dL (ref 31.5–35.7)
MCV: 78 fL — ABNORMAL LOW (ref 79–97)
Monocytes Absolute: 0.5 10*3/uL (ref 0.1–0.9)
Monocytes: 7 %
NEUTROS PCT: 51 %
Neutrophils Absolute: 3.7 10*3/uL (ref 1.4–7.0)
PLATELETS: 167 10*3/uL (ref 150–379)
RBC: 4.42 x10E6/uL (ref 4.14–5.80)
RDW: 17 % — ABNORMAL HIGH (ref 12.3–15.4)
WBC: 7.2 10*3/uL (ref 3.4–10.8)

## 2016-08-03 LAB — RETICULOCYTES: RETIC CT PCT: 1.2 % (ref 0.6–2.6)

## 2016-08-03 NOTE — Progress Notes (Signed)
Internal Medicine Clinic Attending  Case discussed with Dr. Taylor at the time of the visit.  We reviewed the resident's history and exam and pertinent patient test results.  I agree with the assessment, diagnosis, and plan of care documented in the resident's note. 

## 2016-08-21 ENCOUNTER — Encounter: Payer: Self-pay | Admitting: *Deleted

## 2016-11-17 ENCOUNTER — Ambulatory Visit (INDEPENDENT_AMBULATORY_CARE_PROVIDER_SITE_OTHER): Payer: Medicare HMO | Admitting: Internal Medicine

## 2016-11-17 VITALS — BP 124/60 | HR 92 | Temp 97.8°F | Wt 164.7 lb

## 2016-11-17 DIAGNOSIS — R42 Dizziness and giddiness: Secondary | ICD-10-CM

## 2016-11-17 DIAGNOSIS — M62830 Muscle spasm of back: Secondary | ICD-10-CM | POA: Diagnosis not present

## 2016-11-17 DIAGNOSIS — K7030 Alcoholic cirrhosis of liver without ascites: Secondary | ICD-10-CM

## 2016-11-17 DIAGNOSIS — Z720 Tobacco use: Secondary | ICD-10-CM

## 2016-11-17 DIAGNOSIS — B9681 Helicobacter pylori [H. pylori] as the cause of diseases classified elsewhere: Secondary | ICD-10-CM | POA: Diagnosis not present

## 2016-11-17 DIAGNOSIS — M160 Bilateral primary osteoarthritis of hip: Secondary | ICD-10-CM

## 2016-11-17 DIAGNOSIS — K2950 Unspecified chronic gastritis without bleeding: Secondary | ICD-10-CM | POA: Diagnosis not present

## 2016-11-17 MED ORDER — CYCLOBENZAPRINE HCL 5 MG PO TABS: 7.5000 mg | ORAL_TABLET | Freq: Three times a day (TID) | ORAL | Status: DC

## 2016-11-17 MED ORDER — CYCLOBENZAPRINE HCL 5 MG PO TABS: 7.5000 mg | ORAL_TABLET | Freq: Three times a day (TID) | ORAL | Status: AC

## 2016-11-17 NOTE — Progress Notes (Signed)
   CC: Back pain  HPI:  Mr.Javin L Juanetta GoslingHawkins is a 61 y.o. with pmh of bilateral hip arthritis, h pylori gastritis, alcoholic cirrhosis of the liver presented with complaint of back pain in the thoracic region for the past 3 days.   Past Medical History:  Diagnosis Date  . Alcohol abuse   . Chronic low back pain 2000   Reports that he has been treated by orthopedic surgeon, but was unable to continue to to lack of insurance. He has had steroid injections in the hip. He attributes his back pain to his job, which involved lifting heavy objects.  . History of cocaine use    UDS was positive for cocaine in 2011.  Marland Kitchen. Smoking    Review of Systems:    Review of Systems  Respiratory: Negative for shortness of breath.   Cardiovascular: Negative for chest pain.  Gastrointestinal: Negative for nausea and vomiting.  Musculoskeletal: Positive for back pain and joint pain.  Neurological: Positive for dizziness and focal weakness. Negative for sensory change.    Physical Exam:  Vitals:   11/17/16 1545  BP: 124/60  Pulse: 92  Temp: 97.8 F (36.6 C)  TempSrc: Oral  SpO2: 97%  Weight: 164 lb 11.2 oz (74.7 kg)   Physical Exam  Constitutional: He appears well-developed and well-nourished. No distress.  HENT:  Head: Normocephalic and atraumatic.  Cardiovascular: Normal rate, regular rhythm and normal heart sounds.   Pulmonary/Chest: Effort normal and breath sounds normal. He has no wheezes.  Musculoskeletal:       Right hip: He exhibits tenderness (extrenal rotation). He exhibits normal range of motion and no swelling.       Left hip: He exhibits tenderness (internal rotation). He exhibits normal range of motion and no swelling.       Thoracic back: He exhibits tenderness, pain and spasm. He exhibits no swelling, no edema and no deformity.  No tenderness on straight leg raise bilaterally  Neurological: He has normal strength. No sensory deficit.  Reflex Scores:      Patellar reflexes are  2+ on the right side and 2+ on the left side. Psychiatric: He has a normal mood and affect. His behavior is normal. Judgment and thought content normal.    Assessment & Plan:   See Encounters Tab for problem based charting.  Patient seen with Dr. Cyndie ChimeGranfortuna

## 2016-11-17 NOTE — Patient Instructions (Signed)
Thank you for allowing us to be a part of your care. It was very nice to meet you today. Your back pain is likely due to muscle spasm. Please use tylenol as needed. I have also prescribed a muscle relaxant for your pain. Follow up in 3 weeks.    Back Pain, Adult Back pain is very common. The pain often gets better over time. The cause of back pain is usually not dangerous. Most people can learn to manage their back pain on their own. Follow these instructions at home: Watch your back pain for any changes. The following actions may help to lessen any pain you are feeling:  Stay active. Start with short walks on flat ground if you can. Try to walk farther each day.  Exercise regularly as told by your doctor. Exercise helps your back heal faster. It also helps avoid future injury by keeping your muscles strong and flexible.  Do not sit, drive, or stand in one place for more than 30 minutes.  Do not stay in bed. Resting more than 1-2 days can slow down your recovery.  Be careful when you bend or lift an object. Use good form when lifting: ? Bend at your knees. ? Keep the object close to your body. ? Do not twist.  Sleep on a firm mattress. Lie on your side, and bend your knees. If you lie on your back, put a pillow under your knees.  Take medicines only as told by your doctor.  Put ice on the injured area. ? Put ice in a plastic bag. ? Place a towel between your skin and the bag. ? Leave the ice on for 20 minutes, 2-3 times a day for the first 2-3 days. After that, you can switch between ice and heat packs.  Avoid feeling anxious or stressed. Find good ways to deal with stress, such as exercise.  Maintain a healthy weight. Extra weight puts stress on your back.  Contact a doctor if:  You have pain that does not go away with rest or medicine.  You have worsening pain that goes down into your legs or buttocks.  You have pain that does not get better in one week.  You have pain at  night.  You lose weight.  You have a fever or chills. Get help right away if:  You cannot control when you poop (bowel movement) or pee (urinate).  Your arms or legs feel weak.  Your arms or legs lose feeling (numbness).  You feel sick to your stomach (nauseous) or throw up (vomit).  You have belly (abdominal) pain.  You feel like you may pass out (faint). This information is not intended to replace advice given to you by your health care provider. Make sure you discuss any questions you have with your health care provider. Document Released: 08/23/2007 Document Revised: 08/12/2015 Document Reviewed: 07/08/2013 Elsevier Interactive Patient Education  [employer]2018 [employer] Inc[employer]

## 2016-11-18 DIAGNOSIS — M62830 Muscle spasm of back: Secondary | ICD-10-CM | POA: Insufficient documentation

## 2016-11-18 NOTE — Assessment & Plan Note (Addendum)
The patient states that he continues to have pain in bilateral hips with accompanied numbness in his legs. Per Dr. Eliane DecreePatel's notes, he has had chronic pain in his hips. I instructed the patient that it is okay to take one dose of  tylenol for his pain as needed. He is to avoid alcohol. Per ultrasound in March, 2017 the patient has signs of early hepatocellular disease. The patient is to avoid nsaids due to chronic gastritis -Continue tyleonol PM extra strength prn -Need to check patient's hepatic function at next visit -Continue using cane

## 2016-11-18 NOTE — Assessment & Plan Note (Addendum)
The patient states that he has been having back pain for the past 3 days. He states that the pain is in his thoracic region and 8/10 intensity. He did not have any trauma to the area, has not done any heavy lifting, and has not had any recent surgeries in the back.  The patient has not had any fevers, is not a known ivdu although he was a cocaine user in the past, and does not have any history of cancer. He has lost 8lbs in the past 3 months (172.9lbs to 164.7lbs). He states that he used a brace and heating pad that have helped him slightly. He has accompanied numbness in bilateral lower extremities. The patient did not have any pain upon straight leg test. Due to the acute nature of his back pain and it being relieved with brace and head it is likely that he has a muscle spasm -Prescribed Flexeril 7.5mg  tid for 15 days -Will follow up in

## 2016-11-20 NOTE — Progress Notes (Signed)
Medicine attending: I personally interviewed and briefly examined this patient on the day of the patient visit and reviewed pertinent clinical and laboratory data  with resident physician Dr. Lorenso CourierVahini Chundi and we discussed a management plan.

## 2016-11-22 ENCOUNTER — Other Ambulatory Visit: Payer: Self-pay | Admitting: *Deleted

## 2016-11-22 DIAGNOSIS — M62830 Muscle spasm of back: Secondary | ICD-10-CM

## 2016-11-28 MED ORDER — CYCLOBENZAPRINE HCL 7.5 MG PO TABS: 7.5000 mg | ORAL_TABLET | Freq: Three times a day (TID) | ORAL | 1 refills | Status: DC | PRN

## 2016-11-28 NOTE — Telephone Encounter (Signed)
cyclobenzaprine (FLEXERIL) tablet 7.5 mg, refill request @ [facility] [address]. Pt is still waiting to hear something back from us.

## 2016-11-28 NOTE — Telephone Encounter (Signed)
Please schedule Robert Bray to see his PCP on 01/26/2017 so she can reassess if the cyclobenzaprine should be renewed.  I have provided enough refills to get to that appointment, but further refills will require assessment by his PCP.

## 2017-01-26 ENCOUNTER — Encounter: Payer: Medicare HMO | Admitting: Internal Medicine

## 2017-01-29 ENCOUNTER — Ambulatory Visit (INDEPENDENT_AMBULATORY_CARE_PROVIDER_SITE_OTHER): Payer: Self-pay | Admitting: Orthopedic Surgery

## 2017-01-31 ENCOUNTER — Ambulatory Visit (INDEPENDENT_AMBULATORY_CARE_PROVIDER_SITE_OTHER): Payer: Medicare HMO | Admitting: Orthopedic Surgery

## 2017-01-31 ENCOUNTER — Encounter (INDEPENDENT_AMBULATORY_CARE_PROVIDER_SITE_OTHER): Payer: Self-pay | Admitting: Orthopedic Surgery

## 2017-01-31 ENCOUNTER — Ambulatory Visit (INDEPENDENT_AMBULATORY_CARE_PROVIDER_SITE_OTHER): Payer: Medicare HMO

## 2017-01-31 DIAGNOSIS — M5442 Lumbago with sciatica, left side: Secondary | ICD-10-CM

## 2017-01-31 DIAGNOSIS — M4807 Spinal stenosis, lumbosacral region: Secondary | ICD-10-CM

## 2017-01-31 DIAGNOSIS — M5441 Lumbago with sciatica, right side: Secondary | ICD-10-CM | POA: Diagnosis not present

## 2017-01-31 DIAGNOSIS — G8929 Other chronic pain: Secondary | ICD-10-CM

## 2017-02-03 NOTE — Progress Notes (Addendum)
Office Visit Note   Patient: Robert Bray           Date of Birth: 05/26/1955           MRN: [id number] Visit Date: 01/31/2017 Requested by: Lorenso Courierhundi, Vahini, MD [address] [place], [place] [address] PCP: Lorenso Courierhundi, Vahini, MD  Subjective: Chief Complaint  Patient presents with  . Lower Back - Pain    HPI: Earl LitesGregory is a patient with bilateral leg pain right worse and left.  Reports some swelling in his hips.  He also describes numbness and tingling in his legs.  Patient states that he will wake with pain.  He ambulates with a cane.  He denies any groin pain.  He is able to stand only for short periods of time.  He had been in [facility] but he wasn't seen the same provider.  This is been going on for many years.  He describes about 6 minutes of standing endurance.  He is on disability.              ROS: All systems reviewed are negative as they relate to the chief complaint within the history of present illness.  Patient denies  fevers or chills.   Assessment & Plan: Visit Diagnoses:  1. Chronic midline low back pain with bilateral sciatica   2. Spinal stenosis of lumbosacral region     Plan: Impression is low back pain with facet arthritis but generally maintained disc spaces.  Hip joints look reasonable on radiographs today.  I think he may have right-sided spinal stenosis which could respond to injections.  I would like to order MRI scan and have him follow up with Dr. Kristeen MansKnutson.  See him back after that study.  Hold off on therapy for now..  We will need to call Robert Bray his sister and power of attorney about the studies when they come in.  Follow-Up Instructions: Return if symptoms worsen or fail to improve.   Orders:  Orders Placed This Encounter  Procedures  . XR Lumbar Spine 2-3 Views  . MR Lumbar Spine w/o contrast   No orders of the defined types were placed in this encounter.     Procedures: No procedures performed   Clinical  Data: No additional findings.  Objective: Vital Signs: There were no vitals taken for this visit.  Physical Exam:   Constitutional: Patient appears well-developed HEENT:  Head: Normocephalic Eyes:EOM are normal Neck: Normal range of motion Cardiovascular: Normal rate Pulmonary/chest: Effort normal Neurologic: Patient is alert Skin: Skin is warm Psychiatric: Patient has normal mood and affect    Ortho Exam: Orthopedic exam demonstrates normal gait alignment palpable pedal pulses good ankle dorsiflexion plantar flexion quite hamstring strength with no paresthesias L1 S1 bilaterally.  Does have some pain with forward lateral bending but no discrete trochanteric tenderness is noted.  No other masses lymph adenopathy or skin changes noted in the back or leg region.  No muscle atrophy in the legs.  Specialty Comments:  No specialty comments available.  Imaging: No results found.   PMFS History: Patient Active Problem List   Diagnosis Date Noted  . Muscle spasm of back 11/18/2016  . Need for hepatitis A and B vaccination 08/02/2016  . Seasonal allergic rhinitis 08/02/2016  . Helicobacter pylori gastritis (chronic gastritis) 05/01/2016  . Normocytic anemia 02/04/2016  . Achilles tendon injury 02/04/2016  . Cirrhosis of liver (HCC) 06/28/2015  . Monoallelic mutation of HFE gene 95/28/413207/20/2016  .  Syncope 10/02/2014  . Varix of lower extremity 04/30/2013  . Meralgia paraesthetica 04/30/2013  . Bilateral hip joint arthritis 10/24/2012  . Alcohol use disorder, mild, abuse 10/24/2012  . History of cocaine abuse 10/24/2012  . Tobacco abuse 10/24/2012  . Health care maintenance 10/24/2012   Past Medical History:  Diagnosis Date  . Alcohol abuse   . Chronic low back pain 2000   Reports that he has been treated by orthopedic surgeon, but was unable to continue to to lack of insurance. He has had steroid injections in the hip. He attributes his back pain to his job, which involved  lifting heavy objects.  . History of cocaine use    UDS was positive for cocaine in 2011.  Marland Kitchen. Smoking     Family History  Problem Relation Age of Onset  . Hypertension Mother   . Kidney failure Mother        transplant in adult age. Unclear cause   . Hypertension Sister   . Breast cancer Sister   . Brain cancer Brother   . Breast cancer Sister        In remission     History reviewed. No pertinent surgical history. Social History   Occupational History  . Not on file  Tobacco Use  . Smoking status: Current Every Day Smoker    Packs/day: 0.25    Years: 15.00    Pack years: 3.75    Types: Cigarettes  . Smokeless tobacco: Never Used  . Tobacco comment: cutting back  Substance and Sexual Activity  . Alcohol use: No  . Drug use: No  . Sexual activity: Not on file

## 2017-02-20 DIAGNOSIS — Z1211 Encounter for screening for malignant neoplasm of colon: Secondary | ICD-10-CM | POA: Diagnosis not present

## 2017-02-20 DIAGNOSIS — Z86010 Personal history of colonic polyps: Secondary | ICD-10-CM | POA: Diagnosis not present

## 2017-02-20 DIAGNOSIS — K219 Gastro-esophageal reflux disease without esophagitis: Secondary | ICD-10-CM | POA: Diagnosis not present

## 2017-03-01 ENCOUNTER — Ambulatory Visit
Admission: RE | Admit: 2017-03-01 | Discharge: 2017-03-01 | Disposition: A | Discharge status: Home / Self Care | Payer: Medicare HMO | Source: Ambulatory Visit | Attending: Orthopedic Surgery | Admitting: Orthopedic Surgery

## 2017-03-01 DIAGNOSIS — M4807 Spinal stenosis, lumbosacral region: Secondary | ICD-10-CM

## 2017-03-01 DIAGNOSIS — M48061 Spinal stenosis, lumbar region without neurogenic claudication: Secondary | ICD-10-CM | POA: Diagnosis not present

## 2017-03-19 ENCOUNTER — Telehealth (INDEPENDENT_AMBULATORY_CARE_PROVIDER_SITE_OTHER): Payer: Self-pay | Admitting: Orthopedic Surgery

## 2017-03-19 NOTE — Telephone Encounter (Signed)
Pt sister would like someone to give her a call pertaining to her brother Mri report. Please call [phone]

## 2017-03-21 NOTE — Telephone Encounter (Signed)
Can you please advise on report of MRI? Patients family members want to be called with results.

## 2017-03-26 NOTE — Telephone Encounter (Signed)
I called she will call to schedule appt with fn later - she will call me back - 15 min conversation

## 2017-03-28 ENCOUNTER — Telehealth (INDEPENDENT_AMBULATORY_CARE_PROVIDER_SITE_OTHER): Payer: Self-pay | Admitting: Physical Medicine and Rehabilitation

## 2017-03-28 NOTE — Telephone Encounter (Signed)
Ok to OV with me to review MRI and plan

## 2017-03-29 NOTE — Telephone Encounter (Signed)
Scheduled for 04/10/17 at 0930.

## 2017-03-29 NOTE — Telephone Encounter (Signed)
Left message for patient's sister to call me back to schedule.

## 2017-04-06 DIAGNOSIS — Z1211 Encounter for screening for malignant neoplasm of colon: Secondary | ICD-10-CM | POA: Diagnosis not present

## 2017-04-06 DIAGNOSIS — Z86010 Personal history of colonic polyps: Secondary | ICD-10-CM | POA: Diagnosis not present

## 2017-04-10 ENCOUNTER — Telehealth (INDEPENDENT_AMBULATORY_CARE_PROVIDER_SITE_OTHER): Payer: Self-pay | Admitting: Physical Medicine and Rehabilitation

## 2017-04-10 ENCOUNTER — Encounter (INDEPENDENT_AMBULATORY_CARE_PROVIDER_SITE_OTHER): Payer: Self-pay | Admitting: Physical Medicine and Rehabilitation

## 2017-04-10 ENCOUNTER — Ambulatory Visit (INDEPENDENT_AMBULATORY_CARE_PROVIDER_SITE_OTHER): Payer: Medicare HMO | Admitting: Physical Medicine and Rehabilitation

## 2017-04-10 VITALS — BP 121/75 | HR 85

## 2017-04-10 DIAGNOSIS — M47816 Spondylosis without myelopathy or radiculopathy, lumbar region: Secondary | ICD-10-CM

## 2017-04-10 DIAGNOSIS — M5116 Intervertebral disc disorders with radiculopathy, lumbar region: Secondary | ICD-10-CM

## 2017-04-10 DIAGNOSIS — M5416 Radiculopathy, lumbar region: Secondary | ICD-10-CM

## 2017-04-10 DIAGNOSIS — M48062 Spinal stenosis, lumbar region with neurogenic claudication: Secondary | ICD-10-CM

## 2017-04-10 NOTE — Progress Notes (Deleted)
Has some pain in middle of lower back. Worst pain is in right leg. Pain radiates down posterior right leg to ankle. "stays numb all the time." Also has swelling at night. Standing and walking cause the pain to increase.

## 2017-04-11 NOTE — Telephone Encounter (Signed)
Auth No / Request ID  [id number]  Status  Auto-Approved  Decision  Approved  Effective Date  04/16/2017  Expiration Date  05/31/2017  Decision Date  04/11/2017

## 2017-05-07 ENCOUNTER — Encounter (INDEPENDENT_AMBULATORY_CARE_PROVIDER_SITE_OTHER): Payer: Self-pay | Admitting: Physical Medicine and Rehabilitation

## 2017-05-07 ENCOUNTER — Ambulatory Visit (INDEPENDENT_AMBULATORY_CARE_PROVIDER_SITE_OTHER): Payer: Medicare HMO

## 2017-05-07 ENCOUNTER — Ambulatory Visit (INDEPENDENT_AMBULATORY_CARE_PROVIDER_SITE_OTHER): Payer: Medicare HMO | Admitting: Physical Medicine and Rehabilitation

## 2017-05-07 VITALS — BP 117/75 | HR 86 | Temp 97.6°F

## 2017-05-07 DIAGNOSIS — M5416 Radiculopathy, lumbar region: Secondary | ICD-10-CM

## 2017-05-07 MED ORDER — METHYLPREDNISOLONE ACETATE 80 MG/ML IJ SUSP
80.0000 mg | Freq: Once | INTRAMUSCULAR | Status: AC
  Administered 2017-05-07: 80 mg

## 2017-05-07 NOTE — Progress Notes (Deleted)
Pt states a sharp pain in right leg. Pt states pain has been going on for a few years. Pt states putting weight on his right leg makes it worse, sitting down makes pain better. +Driver, -BT, -Dye Allergies.

## 2017-05-07 NOTE — Patient Instructions (Signed)

## 2017-05-08 ENCOUNTER — Encounter (INDEPENDENT_AMBULATORY_CARE_PROVIDER_SITE_OTHER): Payer: Self-pay | Admitting: Physical Medicine and Rehabilitation

## 2017-05-08 NOTE — Progress Notes (Signed)
Robert Bray - 62 y.o. male MRN [id number]  Date of birth: 12-03-1955  Office Visit Note: Visit Date: 05/07/2017 PCP: Lorenso Courier, MD Referred by: Lorenso Courier, MD  Subjective: Chief Complaint  Patient presents with  . Right Leg - Pain   HPI: Robert Bray is a 62 year old gentleman who comes in today for planned right L4-5 interlaminar epidural steroid injection.  To have right radicular leg pain.  Please see our prior evaluation and management note for further details and justification.    ROS Otherwise per HPI.  Assessment & Plan: Visit Diagnoses:  1. Lumbar radiculopathy     Plan: Findings:  Right L4-5 dural steroid injection.  Depending on relief would look at facet joint block and/or physical therapy.  Always return to see Dr. August Saucer as well for follow-up.    Meds & Orders:  Meds ordered this encounter  Medications  . methylPREDNISolone acetate (DEPO-MEDROL) injection 80 mg    Orders Placed This Encounter  Procedures  . XR C-ARM NO REPORT  . Epidural Steroid injection    Follow-up: Return if symptoms worsen or fail to improve.   Procedures: No procedures performed  Lumbar Epidural Steroid Injection - Interlaminar Approach with Fluoroscopic Guidance  Patient: Robert Bray      Date of Birth: 12-15-1955 MRN: [id number] PCP: Lorenso Courier, MD      Visit Date: 05/07/2017   Universal Protocol:     Consent Given By: the patient  Position: PRONE  Additional Comments: Vital signs were monitored before and after the procedure. Patient was prepped and draped in the usual sterile fashion. The correct patient, procedure, and site was verified.   Injection Procedure Details:  Procedure Site One Meds Administered:  Meds ordered this encounter  Medications  . methylPREDNISolone acetate (DEPO-MEDROL) injection 80 mg     Laterality: Right  Location/Site:  L4-L5  Needle size: 20 G  Needle type: Tuohy  Needle Placement: Paramedian  epidural  Findings:   -Comments: Excellent flow of contrast into the epidural space.  Procedure Details: Using a paramedian approach from the side mentioned above, the region overlying the inferior lamina was localized under fluoroscopic visualization and the soft tissues overlying this structure were infiltrated with 4 ml. of 1% Lidocaine without Epinephrine. The Tuohy needle was inserted into the epidural space using a paramedian approach.   The epidural space was localized using loss of resistance along with lateral and bi-planar fluoroscopic views.  After negative aspirate for air, blood, and CSF, a 2 ml. volume of Isovue-250 was injected into the epidural space and the flow of contrast was observed. Radiographs were obtained for documentation purposes.    The injectate was administered into the level noted above.   Additional Comments:  The patient tolerated the procedure well Dressing: Band-Aid    Post-procedure details: Patient was observed during the procedure. Post-procedure instructions were reviewed.  Patient left the clinic in stable condition.   Clinical History: MRI LUMBAR SPINE WITHOUT CONTRAST  TECHNIQUE: Multiplanar, multisequence MR imaging of the lumbar spine was performed. No intravenous contrast was administered.  COMPARISON:  05/25/2010.  FINDINGS: Segmentation:  Standard.  Alignment: Straightening of the normal lumbar lordosis, but no subluxation.  Vertebrae:  No worrisome osseous lesion.  Conus medullaris and cauda equina: Conus extends to the L1. Level. Conus and cauda equina appear normal.  Paraspinal and other soft tissues: Unremarkable.  Disc levels:  L1-L2: Disc space narrowing with desiccation. Central and rightward extrusion, slight cephalad migration. Possible RIGHT L1 and  RIGHT L2 nerve root impingement.  L2-L3: Disc space narrowing with desiccation. Annular bulge. Foraminal protrusion/annular rent on the LEFT. Facet  arthropathy. Borderline stenosis. LEFT L2 and LEFT L3 nerve root impingement are possible.  L3-L4: Slight loss of disc height and signal. Annular bulge. Disc space narrowing. Facet arthropathy. Foraminal protrusion on the LEFT. LEFT L3 impingement is possible.  L4-L5: Disc space narrowing with desiccation. Central protrusion. Posterior element hypertrophy. RIGHT greater than LEFT L4 and L5 nerve root impingement. Borderline stenosis.  L5-S1:  Good disc height and signal.  No impingement.  Compared with 2012, there has been partial regression of the L1-2 extrusion.  IMPRESSION: Multilevel spondylosis as described. Potentially symptomatic LEFT-sided neural impingement from L2-3 through L4-5. Potentially symptomatic RIGHT-sided neural impingement at L1-2, and L4-5. Some regression of the L1-2 extrusion compared with 2012.  Posterior element hypertrophy compounds the observed subarticular zone and foraminal zone narrowing at multiple levels.   Electronically Signed   By: Elsie Stain M.D.   On: 03/01/2017 15:33  He reports that he has been smoking cigarettes.  He has a 3.75 pack-year smoking history. he has never used smokeless tobacco. No results for input(s): HGBA1C, LABURIC in the last 8760 hours.  Objective:  VS:  HT:    WT:   BMI:     BP:117/75  HR:86bpm  TEMP:97.6 F (36.4 C)( )  RESP:98 % Physical Exam  Ortho Exam Imaging: Xr C-arm No Report  Result Date: 05/07/2017 Please see Notes or Procedures tab for imaging impression.   Past Medical/Family/Surgical/Social History: Medications & Allergies reviewed per EMR Patient Active Problem List   Diagnosis Date Noted  . Muscle spasm of back 11/18/2016  . Need for hepatitis A and B vaccination 08/02/2016  . Seasonal allergic rhinitis 08/02/2016  . Helicobacter pylori gastritis (chronic gastritis) 05/01/2016  . Normocytic anemia 02/04/2016  . Achilles tendon injury 02/04/2016  . Cirrhosis of liver (HCC)  06/28/2015  . Monoallelic mutation of HFE gene 09/81/1914  . Syncope 10/02/2014  . Varix of lower extremity 04/30/2013  . Meralgia paraesthetica 04/30/2013  . Bilateral hip joint arthritis 10/24/2012  . Alcohol use disorder, mild, abuse 10/24/2012  . History of cocaine abuse 10/24/2012  . Tobacco abuse 10/24/2012  . Health care maintenance 10/24/2012   Past Medical History:  Diagnosis Date  . Alcohol abuse   . Chronic low back pain 2000   Reports that he has been treated by orthopedic surgeon, but was unable to continue to to lack of insurance. He has had steroid injections in the hip. He attributes his back pain to his job, which involved lifting heavy objects.  . History of cocaine use    UDS was positive for cocaine in 2011.  Marland Kitchen Smoking    Family History  Problem Relation Age of Onset  . Hypertension Mother   . Kidney failure Mother        transplant in adult age. Unclear cause   . Hypertension Sister   . Breast cancer Sister   . Brain cancer Brother   . Breast cancer Sister        In remission    History reviewed. No pertinent surgical history. Social History   Occupational History  . Not on file  Tobacco Use  . Smoking status: Current Every Day Smoker    Packs/day: 0.25    Years: 15.00    Pack years: 3.75    Types: Cigarettes  . Smokeless tobacco: Never Used  . Tobacco comment: cutting back  Substance  and Sexual Activity  . Alcohol use: No  . Drug use: No  . Sexual activity: Not on file

## 2017-05-08 NOTE — Progress Notes (Signed)
Robert Bray - 62 y.o. male MRN [id number]  Date of birth: 1955/06/23  Office Visit Note: Visit Date: 04/10/2017 PCP: Lorenso Courier, MD Referred by: Lorenso Courier, MD  Subjective: Chief Complaint  Patient presents with  . Lower Back - Pain   HPI: Robert Bray is a 62 year old gentleman followed by Dr. August Saucer pleural wall was initially mostly right hip and leg pain.  Robert Bray initially has seen Dr. August Saucer for his bilateral hip and low back pain.  Robert Bray has a somewhat complicated medical and orthopedic history with history of cirrhosis of the liver as well as meralgia paresthetica at some point.  Robert Bray also has had ongoing low back pain was treated by someone in orthopedics in the past with what Robert Bray felt like with steroid injections that helped.  Dr. August Saucer initially saw him and determined Robert Bray did not really feel like this was a issue with his hips per se and ultimately did get an MRI of the lumbar spine.  This is reviewed with the patient and I believe his sister today.  The patient complains of mainly right sided hip and leg pain.  Robert Bray also reports pain in the middle of the lower back.  The pain radiates down the posterior lateral side of the right leg to the fairly classic L5 distribution.  Robert Bray reports that it "stays numb all the time ".  Robert Bray also reports some swelling lower back and hip area.  Robert Bray reports worsening with standing and walking.  Robert Bray gets some relief at rest and not moving.  Robert Bray denies any specific groin pain.  Robert Bray denies any focal weakness or foot drop.  Robert Bray has had no other red flag symptoms.  Robert Bray does not report any specific trauma and has had no prior surgery.    Review of Systems  Constitutional: Negative for chills, fever, malaise/fatigue and weight loss.  HENT: Negative for hearing loss and sinus pain.   Eyes: Negative for blurred vision, double vision and photophobia.  Respiratory: Negative for cough and shortness of breath.   Cardiovascular: Negative for chest pain, palpitations and leg  swelling.  Gastrointestinal: Negative for abdominal pain, nausea and vomiting.  Genitourinary: Negative for flank pain.  Musculoskeletal: Positive for back pain. Negative for myalgias.       Right hip and leg pain  Skin: Negative for itching and rash.  Neurological: Positive for tingling. Negative for tremors, focal weakness and weakness.  Endo/Heme/Allergies: Negative.   Psychiatric/Behavioral: Negative for depression.  All other systems reviewed and are negative.  Otherwise per HPI.  Assessment & Plan: Visit Diagnoses:  1. Lumbar radiculopathy   2. Spinal stenosis of lumbar region with neurogenic claudication   3. Spondylosis without myelopathy or radiculopathy, lumbar region   4. Radiculopathy due to lumbar intervertebral disc disorder     Plan: Findings:  Pain is most consistent with a right L5 radiculitis radiculopathy.  Robert Bray does have a central disc protrusion at L4-5 with facet arthropathy causing lateral recess narrowing bilaterally.  Robert Bray has multiple areas of foraminal narrowing both left and right.  Robert Bray has no high-grade central stenosis or severe disc rupture.  I do think the best approach is a diagnostic right L4-5 interlaminar epidural steroid injection.  If Robert Bray gets good relief for that that I think regrouping with a physical therapist in the short run for core strengthening flexibility and strengthening would be a good idea.  If Robert Bray does not get much relief we could entertain the idea of a facet joint block  for something such as facet joint syndrome versus a transforaminal injection at L5.  Current medications are sufficient.  We will see him back for the injection.    Meds & Orders: No orders of the defined types were placed in this encounter.  No orders of the defined types were placed in this encounter.   Follow-up: Return for Right L4-5 interlaminar epidural steroid injection.   Procedures: No procedures performed  No notes on file   Clinical History: MRI LUMBAR SPINE  WITHOUT CONTRAST  TECHNIQUE: Multiplanar, multisequence MR imaging of the lumbar spine was performed. No intravenous contrast was administered.  COMPARISON:  05/25/2010.  FINDINGS: Segmentation:  Standard.  Alignment: Straightening of the normal lumbar lordosis, but no subluxation.  Vertebrae:  No worrisome osseous lesion.  Conus medullaris and cauda equina: Conus extends to the L1. Level. Conus and cauda equina appear normal.  Paraspinal and other soft tissues: Unremarkable.  Disc levels:  L1-L2: Disc space narrowing with desiccation. Central and rightward extrusion, slight cephalad migration. Possible RIGHT L1 and RIGHT L2 nerve root impingement.  L2-L3: Disc space narrowing with desiccation. Annular bulge. Foraminal protrusion/annular rent on the LEFT. Facet arthropathy. Borderline stenosis. LEFT L2 and LEFT L3 nerve root impingement are possible.  L3-L4: Slight loss of disc height and signal. Annular bulge. Disc space narrowing. Facet arthropathy. Foraminal protrusion on the LEFT. LEFT L3 impingement is possible.  L4-L5: Disc space narrowing with desiccation. Central protrusion. Posterior element hypertrophy. RIGHT greater than LEFT L4 and L5 nerve root impingement. Borderline stenosis.  L5-S1:  Good disc height and signal.  No impingement.  Compared with 2012, there has been partial regression of the L1-2 extrusion.  IMPRESSION: Multilevel spondylosis as described. Potentially symptomatic LEFT-sided neural impingement from L2-3 through L4-5. Potentially symptomatic RIGHT-sided neural impingement at L1-2, and L4-5. Some regression of the L1-2 extrusion compared with 2012.  Posterior element hypertrophy compounds the observed subarticular zone and foraminal zone narrowing at multiple levels.   Electronically Signed   By: Elsie Stain M.D.   On: 03/01/2017 15:33  Robert Bray reports that Robert Bray has been smoking cigarettes.  Robert Bray has a 3.75 pack-year  smoking history. Robert Bray has never used smokeless tobacco. No results for input(s): HGBA1C, LABURIC in the last 8760 hours.  Objective:  VS:  HT:    WT:   BMI:     BP:121/75  HR:85bpm  TEMP: ( )  RESP:  Physical Exam  Constitutional: Robert Bray is oriented to person, place, and time. Robert Bray appears well-developed and well-nourished. No distress.  HENT:  Head: Normocephalic and atraumatic.  Eyes: Conjunctivae are normal. Pupils are equal, round, and reactive to light.  Neck: Normal range of motion. Neck supple.  Cardiovascular: Regular rhythm and intact distal pulses.  Pulmonary/Chest: Effort normal. No respiratory distress.  Musculoskeletal:  Patient is somewhat slow to rise from a seated position and does have pain with extension of the lumbar spine.  Robert Bray has no pain with hip rotation.  Is mild tenderness over the right greater trochanter more than the left.  Robert Bray has a negative slump test bilaterally.  Robert Bray has good distal strength without clonus.  Neurological: Robert Bray is alert and oriented to person, place, and time. Robert Bray exhibits normal muscle tone. Coordination normal.  Skin: Skin is warm and dry. No rash noted. No erythema.  Psychiatric: Robert Bray has a normal mood and affect.  Nursing note and vitals reviewed.   Ortho Exam Imaging: Xr C-arm No Report  Result Date: 05/07/2017 Please see Notes or Procedures tab  for imaging impression.   Past Medical/Family/Surgical/Social History: Medications & Allergies reviewed per EMR Patient Active Problem List   Diagnosis Date Noted  . Muscle spasm of back 11/18/2016  . Need for hepatitis A and B vaccination 08/02/2016  . Seasonal allergic rhinitis 08/02/2016  . Helicobacter pylori gastritis (chronic gastritis) 05/01/2016  . Normocytic anemia 02/04/2016  . Achilles tendon injury 02/04/2016  . Cirrhosis of liver (HCC) 06/28/2015  . Monoallelic mutation of HFE gene 96/04/540907/20/2016  . Syncope 10/02/2014  . Varix of lower extremity 04/30/2013  . Meralgia paraesthetica  04/30/2013  . Bilateral hip joint arthritis 10/24/2012  . Alcohol use disorder, mild, abuse 10/24/2012  . History of cocaine abuse 10/24/2012  . Tobacco abuse 10/24/2012  . Health care maintenance 10/24/2012   Past Medical History:  Diagnosis Date  . Alcohol abuse   . Chronic low back pain 2000   Reports that Robert Bray has been treated by orthopedic surgeon, but was unable to continue to to lack of insurance. Robert Bray has had steroid injections in the hip. Robert Bray attributes his back pain to his job, which involved lifting heavy objects.  . History of cocaine use    UDS was positive for cocaine in 2011.  Marland Kitchen. Smoking    Family History  Problem Relation Age of Onset  . Hypertension Mother   . Kidney failure Mother        transplant in adult age. Unclear cause   . Hypertension Sister   . Breast cancer Sister   . Brain cancer Brother   . Breast cancer Sister        In remission    History reviewed. No pertinent surgical history. Social History   Occupational History  . Not on file  Tobacco Use  . Smoking status: Current Every Day Smoker    Packs/day: 0.25    Years: 15.00    Pack years: 3.75    Types: Cigarettes  . Smokeless tobacco: Never Used  . Tobacco comment: cutting back  Substance and Sexual Activity  . Alcohol use: No  . Drug use: No  . Sexual activity: Not on file

## 2017-05-08 NOTE — Procedures (Signed)
Lumbar Epidural Steroid Injection - Interlaminar Approach with Fluoroscopic Guidance  Patient: Robert ChafeGregory L Bray      Date of Birth: 09/05/1955 MRN: [id number] PCP: Lorenso Courierhundi, Vahini, MD      Visit Date: 05/07/2017   Universal Protocol:     Consent Given By: the patient  Position: PRONE  Additional Comments: Vital signs were monitored before and after the procedure. Patient was prepped and draped in the usual sterile fashion. The correct patient, procedure, and site was verified.   Injection Procedure Details:  Procedure Site One Meds Administered:  Meds ordered this encounter  Medications  . methylPREDNISolone acetate (DEPO-MEDROL) injection 80 mg     Laterality: Right  Location/Site:  L4-L5  Needle size: 20 G  Needle type: Tuohy  Needle Placement: Paramedian epidural  Findings:   -Comments: Excellent flow of contrast into the epidural space.  Procedure Details: Using a paramedian approach from the side mentioned above, the region overlying the inferior lamina was localized under fluoroscopic visualization and the soft tissues overlying this structure were infiltrated with 4 ml. of 1% Lidocaine without Epinephrine. The Tuohy needle was inserted into the epidural space using a paramedian approach.   The epidural space was localized using loss of resistance along with lateral and bi-planar fluoroscopic views.  After negative aspirate for air, blood, and CSF, a 2 ml. volume of Isovue-250 was injected into the epidural space and the flow of contrast was observed. Radiographs were obtained for documentation purposes.    The injectate was administered into the level noted above.   Additional Comments:  The patient tolerated the procedure well Dressing: Band-Aid    Post-procedure details: Patient was observed during the procedure. Post-procedure instructions were reviewed.  Patient left the clinic in stable condition.

## 2017-06-14 DIAGNOSIS — H5203 Hypermetropia, bilateral: Secondary | ICD-10-CM | POA: Diagnosis not present

## 2017-06-14 DIAGNOSIS — H52223 Regular astigmatism, bilateral: Secondary | ICD-10-CM | POA: Diagnosis not present

## 2017-06-14 DIAGNOSIS — H524 Presbyopia: Secondary | ICD-10-CM | POA: Diagnosis not present

## 2017-07-25 IMAGING — CR DG FOOT COMPLETE 3+V*L*
3 series · 3 of 3 positions shown · non-contrast
Comparison: None.

CLINICAL DATA: Pain and swelling

EXAM:
LEFT FOOT - COMPLETE 3+ VIEW

[foot ap]
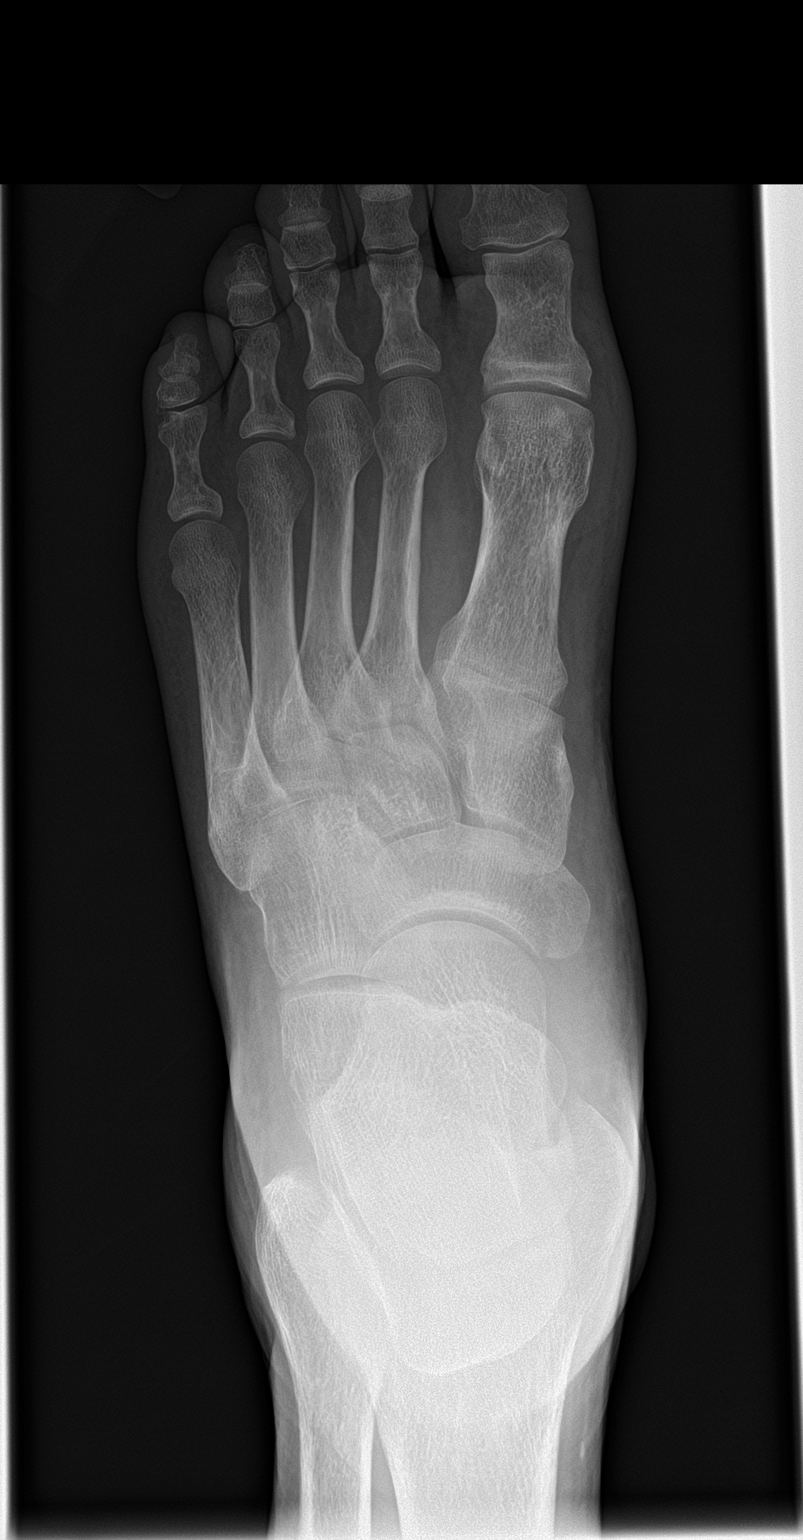

[foot obl]
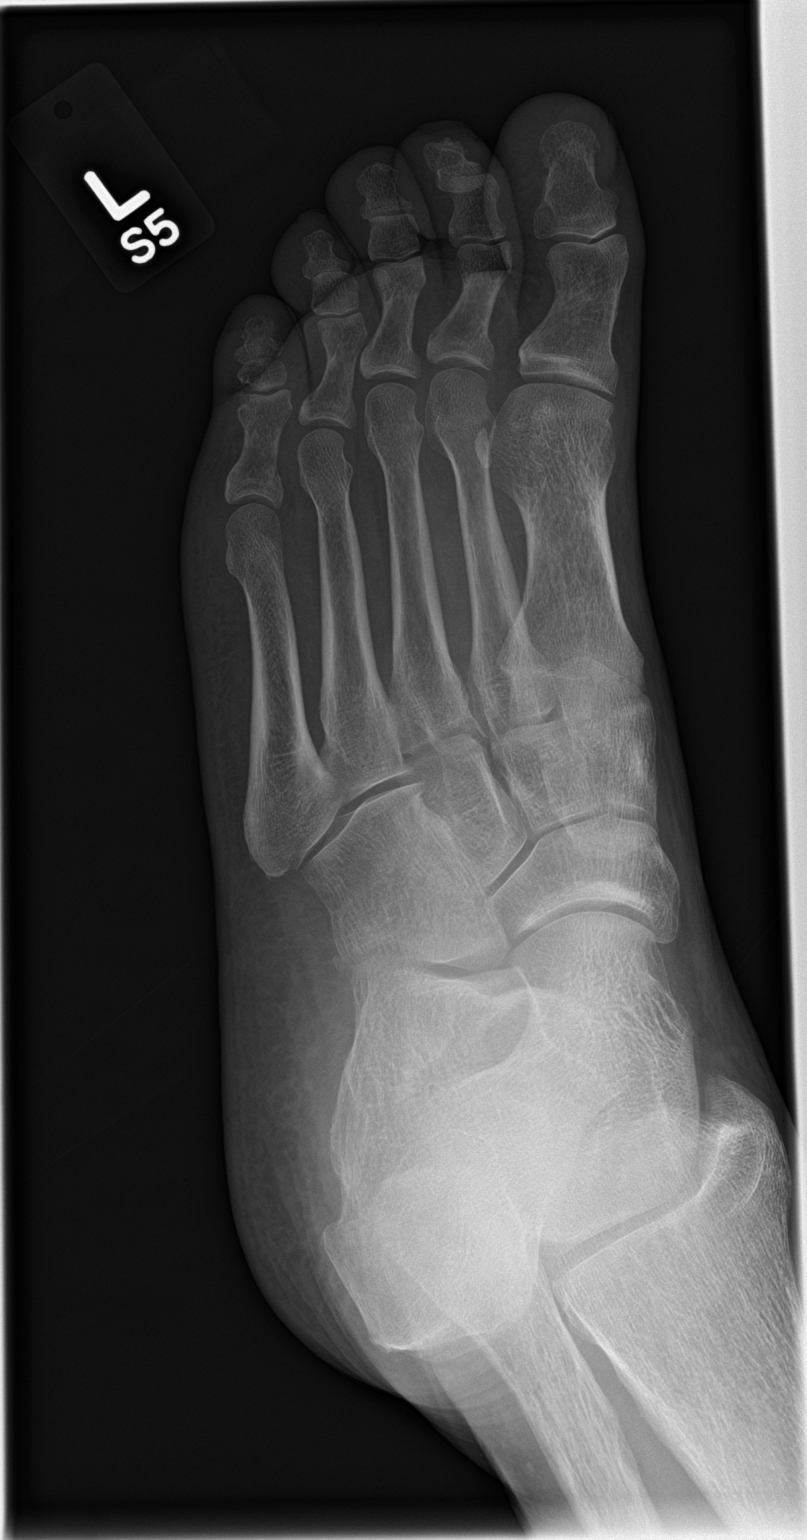

[foot lat]
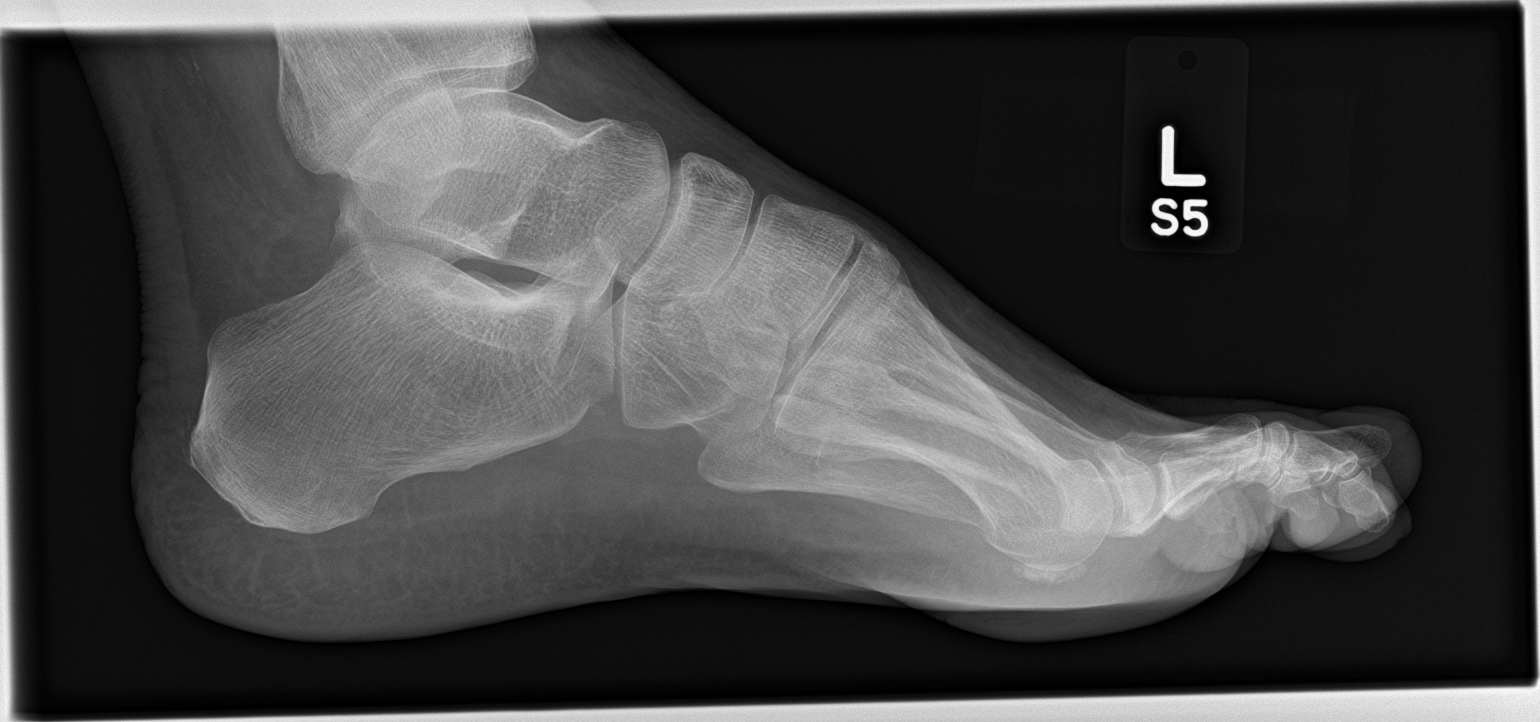

[3 of 3 positions shown; findings below may reference images not displayed]

FINDINGS: Frontal, oblique, and lateral views obtained. There is no fracture
or dislocation. Joint spaces appear normal. No erosive change.
IMPRESSION: No fracture or dislocation.  No evident arthropathy.

## 2017-08-15 ENCOUNTER — Telehealth (INDEPENDENT_AMBULATORY_CARE_PROVIDER_SITE_OTHER): Payer: Self-pay | Admitting: Physical Medicine and Rehabilitation

## 2017-08-15 NOTE — Telephone Encounter (Signed)
He is [employer] if pain was better then repeat ok and document relief and <3 months - if did not help much plan was L5 TF esi

## 2017-08-16 NOTE — Telephone Encounter (Signed)
Pt is scheduled 09/11/17 with driver, No BTs  Auth No / Request ID [id number] Status Auto-Approved Decision Approved Effective Date 08/20/2017 Expiration Date 10/04/2017

## 2017-08-16 NOTE — Telephone Encounter (Signed)
Patient had at least 85% relief from right L4-5 IL. Needs auth for repeat.

## 2017-09-11 ENCOUNTER — Ambulatory Visit (INDEPENDENT_AMBULATORY_CARE_PROVIDER_SITE_OTHER): Payer: Self-pay

## 2017-09-11 ENCOUNTER — Encounter (INDEPENDENT_AMBULATORY_CARE_PROVIDER_SITE_OTHER): Payer: Self-pay | Admitting: Physical Medicine and Rehabilitation

## 2017-09-11 ENCOUNTER — Encounter

## 2017-09-11 ENCOUNTER — Ambulatory Visit (INDEPENDENT_AMBULATORY_CARE_PROVIDER_SITE_OTHER): Payer: 59 | Admitting: Physical Medicine and Rehabilitation

## 2017-09-11 VITALS — BP 139/93 | HR 78

## 2017-09-11 DIAGNOSIS — M5116 Intervertebral disc disorders with radiculopathy, lumbar region: Secondary | ICD-10-CM

## 2017-09-11 DIAGNOSIS — M5416 Radiculopathy, lumbar region: Secondary | ICD-10-CM

## 2017-09-11 MED ORDER — METHYLPREDNISOLONE ACETATE 80 MG/ML IJ SUSP
80.0000 mg | Freq: Once | INTRAMUSCULAR | Status: AC
  Administered 2017-09-11: 80 mg

## 2017-09-11 NOTE — Progress Notes (Signed)
 .  Numeric Pain Rating Scale and Functional Assessment Average Pain 7   In the last MONTH (on 0-10 scale) has pain interfered with the following?  1. General activity like being  able to carry out your everyday physical activities such as walking, climbing stairs, carrying groceries, or moving a chair?  Rating(2)   +Driver, -BT, -Dye Allergies.  

## 2017-09-11 NOTE — Patient Instructions (Signed)

## 2017-09-13 NOTE — Procedures (Signed)
Lumbar Epidural Steroid Injection - Interlaminar Approach with Fluoroscopic Guidance  Patient: Robert Bray      Date of Birth: 05/22/1955 MRN: [id number] PCP: Lorenso Courierhundi, Vahini, MD      Visit Date: 09/11/2017   Universal Protocol:     Consent Given By: the patient  Position: PRONE  Additional Comments: Vital signs were monitored before and after the procedure. Patient was prepped and draped in the usual sterile fashion. The correct patient, procedure, and site was verified.   Injection Procedure Details:  Procedure Site One Meds Administered:  Meds ordered this encounter  Medications  . methylPREDNISolone acetate (DEPO-MEDROL) injection 80 mg     Laterality: Right  Location/Site:  L4-L5  Needle size: 20 G  Needle type: Tuohy  Needle Placement: Paramedian epidural  Findings:   -Comments: Excellent flow of contrast into the epidural space.  Procedure Details: Using a paramedian approach from the side mentioned above, the region overlying the inferior lamina was localized under fluoroscopic visualization and the soft tissues overlying this structure were infiltrated with 4 ml. of 1% Lidocaine without Epinephrine. The Tuohy needle was inserted into the epidural space using a paramedian approach.   The epidural space was localized using loss of resistance along with lateral and bi-planar fluoroscopic views.  After negative aspirate for air, blood, and CSF, a 2 ml. volume of Isovue-250 was injected into the epidural space and the flow of contrast was observed. Radiographs were obtained for documentation purposes.    The injectate was administered into the level noted above.   Additional Comments:  The patient tolerated the procedure well Dressing: Band-Aid    Post-procedure details: Patient was observed during the procedure. Post-procedure instructions were reviewed.  Patient left the clinic in stable condition.

## 2017-09-13 NOTE — Progress Notes (Signed)
Robert Bray - 62 y.o. male MRN [id number]  Date of birth: 01/26/1956  Office Visit Note: Visit Date: 09/11/2017 PCP: Lorenso Courierhundi, Vahini, MD Referred by: Lorenso Courierhundi, Vahini, MD  Subjective: Chief Complaint  Patient presents with  . Lower Back - Pain  . Right Leg - Pain   HPI: Robert Bray is a 62 year old gentleman with history of chronic worsening low back and right hip and leg pain.  He reports that prior epidural injection at L4-5 gave him 90% relief for about 4 weeks and then slow return of symptoms.  He reports worsening with standing for a long time and this is consistent.  MRI is reviewed again.  Average pain is 7 out of 10.  We are to repeat the L4-5 interlaminar epidural steroid injection.  In the future consider transforaminal approach.   ROS Otherwise per HPI.  Assessment & Plan: Visit Diagnoses:  1. Lumbar radiculopathy   2. Radiculopathy due to lumbar intervertebral disc disorder     Plan: No additional findings.   Meds & Orders:  Meds ordered this encounter  Medications  . methylPREDNISolone acetate (DEPO-MEDROL) injection 80 mg    Orders Placed This Encounter  Procedures  . XR C-ARM NO REPORT  . Epidural Steroid injection    Follow-up: Return if symptoms worsen or fail to improve.   Procedures: No procedures performed  Lumbar Epidural Steroid Injection - Interlaminar Approach with Fluoroscopic Guidance  Robert Bray      Date of Birth: 07/21/1955 MRN: [id number] PCP: Lorenso Courierhundi, Vahini, MD      Visit Date: 09/11/2017   Universal Protocol:     Consent Given By: the patient  Position: PRONE  Additional Comments: Vital signs were monitored before and after the procedure. Patient was prepped and draped in the usual sterile fashion. The correct patient, procedure, and site was verified.   Injection Procedure Details:  Procedure Site One Meds Administered:  Meds ordered this encounter  Medications  . methylPREDNISolone acetate  (DEPO-MEDROL) injection 80 mg     Laterality: Right  Location/Site:  L4-L5  Needle size: 20 G  Needle type: Tuohy  Needle Placement: Paramedian epidural  Findings:   -Comments: Excellent flow of contrast into the epidural space.  Procedure Details: Using a paramedian approach from the side mentioned above, the region overlying the inferior lamina was localized under fluoroscopic visualization and the soft tissues overlying this structure were infiltrated with 4 ml. of 1% Lidocaine without Epinephrine. The Tuohy needle was inserted into the epidural space using a paramedian approach.   The epidural space was localized using loss of resistance along with lateral and bi-planar fluoroscopic views.  After negative aspirate for air, blood, and CSF, a 2 ml. volume of Isovue-250 was injected into the epidural space and the flow of contrast was observed. Radiographs were obtained for documentation purposes.    The injectate was administered into the level noted above.   Additional Comments:  The patient tolerated the procedure well Dressing: Band-Aid    Post-procedure details: Patient was observed during the procedure. Post-procedure instructions were reviewed.  Patient left the clinic in stable condition.   Clinical History: MRI LUMBAR SPINE WITHOUT CONTRAST  TECHNIQUE: Multiplanar, multisequence MR imaging of the lumbar spine was performed. No intravenous contrast was administered.  COMPARISON:  05/25/2010.  FINDINGS: Segmentation:  Standard.  Alignment: Straightening of the normal lumbar lordosis, but no subluxation.  Vertebrae:  No worrisome osseous lesion.  Conus medullaris and cauda equina: Conus extends to the L1. Level.  Conus and cauda equina appear normal.  Paraspinal and other soft tissues: Unremarkable.  Disc levels:  L1-L2: Disc space narrowing with desiccation. Central and rightward extrusion, slight cephalad migration. Possible RIGHT L1 and  RIGHT L2 nerve root impingement.  L2-L3: Disc space narrowing with desiccation. Annular bulge. Foraminal protrusion/annular rent on the LEFT. Facet arthropathy. Borderline stenosis. LEFT L2 and LEFT L3 nerve root impingement are possible.  L3-L4: Slight loss of disc height and signal. Annular bulge. Disc space narrowing. Facet arthropathy. Foraminal protrusion on the LEFT. LEFT L3 impingement is possible.  L4-L5: Disc space narrowing with desiccation. Central protrusion. Posterior element hypertrophy. RIGHT greater than LEFT L4 and L5 nerve root impingement. Borderline stenosis.  L5-S1:  Good disc height and signal.  No impingement.  Compared with 2012, there has been partial regression of the L1-2 extrusion.  IMPRESSION: Multilevel spondylosis as described. Potentially symptomatic LEFT-sided neural impingement from L2-3 through L4-5. Potentially symptomatic RIGHT-sided neural impingement at L1-2, and L4-5. Some regression of the L1-2 extrusion compared with 2012.  Posterior element hypertrophy compounds the observed subarticular zone and foraminal zone narrowing at multiple levels.   Electronically Signed   By: Elsie Stain M.D.   On: 03/01/2017 15:33   He reports that he has been smoking cigarettes.  He has a 3.75 pack-year smoking history. He has never used smokeless tobacco. No results for input(s): HGBA1C, LABURIC in the last 8760 hours.  Objective:  VS:  HT:    WT:   BMI:     BP:(!) 139/93  HR:78bpm  TEMP: ( )  RESP:  Physical Exam  Ortho Exam Imaging: No results found.  Past Medical/Family/Surgical/Social History: Medications & Allergies reviewed per EMR, new medications updated. Patient Active Problem List   Diagnosis Date Noted  . Muscle spasm of back 11/18/2016  . Need for hepatitis A and B vaccination 08/02/2016  . Seasonal allergic rhinitis 08/02/2016  . Helicobacter pylori gastritis (chronic gastritis) 05/01/2016  . Normocytic anemia  02/04/2016  . Achilles tendon injury 02/04/2016  . Cirrhosis of liver (HCC) 06/28/2015  . Monoallelic mutation of HFE gene 16/12/9602  . Syncope 10/02/2014  . Varix of lower extremity 04/30/2013  . Meralgia paraesthetica 04/30/2013  . Bilateral hip joint arthritis 10/24/2012  . Alcohol use disorder, mild, abuse 10/24/2012  . History of cocaine abuse 10/24/2012  . Tobacco abuse 10/24/2012  . Health care maintenance 10/24/2012   Past Medical History:  Diagnosis Date  . Alcohol abuse   . Chronic low back pain 2000   Reports that he has been treated by orthopedic surgeon, but was unable to continue to to lack of insurance. He has had steroid injections in the hip. He attributes his back pain to his job, which involved lifting heavy objects.  . History of cocaine use    UDS was positive for cocaine in 2011.  Marland Kitchen Smoking    Family History  Problem Relation Age of Onset  . Hypertension Mother   . Kidney failure Mother        transplant in adult age. Unclear cause   . Hypertension Sister   . Breast cancer Sister   . Brain cancer Brother   . Breast cancer Sister        In remission    History reviewed. No pertinent surgical history. Social History   Occupational History  . Not on file  Tobacco Use  . Smoking status: Current Every Day Smoker    Packs/day: 0.25    Years: 15.00  Pack years: 3.75    Types: Cigarettes  . Smokeless tobacco: Never Used  . Tobacco comment: cutting back  Substance and Sexual Activity  . Alcohol use: No  . Drug use: No  . Sexual activity: Not on file

## 2018-01-17 DIAGNOSIS — Z79899 Other long term (current) drug therapy: Secondary | ICD-10-CM | POA: Diagnosis not present

## 2018-01-17 DIAGNOSIS — Z131 Encounter for screening for diabetes mellitus: Secondary | ICD-10-CM | POA: Diagnosis not present

## 2018-01-17 DIAGNOSIS — G8929 Other chronic pain: Secondary | ICD-10-CM | POA: Diagnosis not present

## 2018-01-17 DIAGNOSIS — M5441 Lumbago with sciatica, right side: Secondary | ICD-10-CM | POA: Diagnosis not present

## 2018-01-17 DIAGNOSIS — M129 Arthropathy, unspecified: Secondary | ICD-10-CM | POA: Diagnosis not present

## 2018-01-17 DIAGNOSIS — R5383 Other fatigue: Secondary | ICD-10-CM | POA: Diagnosis not present

## 2018-01-17 DIAGNOSIS — Z760 Encounter for issue of repeat prescription: Secondary | ICD-10-CM | POA: Diagnosis not present

## 2018-01-17 DIAGNOSIS — M5442 Lumbago with sciatica, left side: Secondary | ICD-10-CM | POA: Diagnosis not present

## 2018-01-17 DIAGNOSIS — E559 Vitamin D deficiency, unspecified: Secondary | ICD-10-CM | POA: Diagnosis not present

## 2018-01-17 DIAGNOSIS — E7800 Pure hypercholesterolemia, unspecified: Secondary | ICD-10-CM | POA: Diagnosis not present

## 2018-01-17 DIAGNOSIS — Z0000 Encounter for general adult medical examination without abnormal findings: Secondary | ICD-10-CM | POA: Diagnosis not present

## 2018-05-06 ENCOUNTER — Emergency Department (HOSPITAL_BASED_OUTPATIENT_CLINIC_OR_DEPARTMENT_OTHER)
Admission: EM | Admit: 2018-05-06 | Discharge: 2018-05-06 | Disposition: A | Discharge status: Home / Self Care | Payer: Medicare HMO | Attending: Emergency Medicine | Admitting: Emergency Medicine

## 2018-05-06 ENCOUNTER — Encounter (HOSPITAL_BASED_OUTPATIENT_CLINIC_OR_DEPARTMENT_OTHER): Payer: Self-pay

## 2018-05-06 ENCOUNTER — Other Ambulatory Visit: Payer: Self-pay

## 2018-05-06 DIAGNOSIS — M79674 Pain in right toe(s): Secondary | ICD-10-CM | POA: Diagnosis present

## 2018-05-06 DIAGNOSIS — M109 Gout, unspecified: Secondary | ICD-10-CM | POA: Diagnosis not present

## 2018-05-06 DIAGNOSIS — F17210 Nicotine dependence, cigarettes, uncomplicated: Secondary | ICD-10-CM | POA: Diagnosis not present

## 2018-05-06 DIAGNOSIS — Z79899 Other long term (current) drug therapy: Secondary | ICD-10-CM | POA: Diagnosis not present

## 2018-05-06 DIAGNOSIS — M10072 Idiopathic gout, left ankle and foot: Secondary | ICD-10-CM | POA: Diagnosis not present

## 2018-05-06 MED ORDER — ONDANSETRON 4 MG PO TBDP
4.0000 mg | ORAL_TABLET | Freq: Once | ORAL | Status: AC
  Administered 2018-05-06: 4 mg via ORAL
  Filled 2018-05-06: qty 1

## 2018-05-06 MED ORDER — MORPHINE SULFATE (PF) 4 MG/ML IV SOLN
4.0000 mg | Freq: Once | INTRAVENOUS | Status: AC
  Administered 2018-05-06: 4 mg via INTRAMUSCULAR
  Filled 2018-05-06: qty 1

## 2018-05-06 MED ORDER — DEXAMETHASONE SODIUM PHOSPHATE 10 MG/ML IJ SOLN: 10.0000 mg | Freq: Once | INTRAMUSCULAR | Status: DC

## 2018-05-06 MED ORDER — COLCHICINE 0.6 MG PO TABS
1.2000 mg | ORAL_TABLET | Freq: Once | ORAL | Status: AC
  Administered 2018-05-06: 1.2 mg via ORAL
  Filled 2018-05-06: qty 2

## 2018-05-06 MED ORDER — DEXAMETHASONE SODIUM PHOSPHATE 10 MG/ML IJ SOLN
10.0000 mg | Freq: Once | INTRAMUSCULAR | Status: AC
  Administered 2018-05-06: 10 mg via INTRAMUSCULAR
  Filled 2018-05-06: qty 1

## 2018-05-06 MED ORDER — COLCHICINE-PROBENECID 0.5-500 MG PO TABS: 1.0000 | ORAL_TABLET | Freq: Every day | ORAL | 1 refills | Status: DC

## 2018-05-06 NOTE — Discharge Instructions (Addendum)
Your vital signs are within normal limits.  Your examination favors gouty arthritis of the big toe.  Please use colchicine daily with a meal.  Use Ultram for assistance with pain.  Keep your foot is warm as possible.  Please see your primary physician for additional evaluation and work-up as soon as possible.  Return to the emergency department if any emergent changes in your condition, problems, or concerns.

## 2018-05-06 NOTE — ED Notes (Addendum)
Family of patient came out x 2 complaining of wait time.  Explained that the providers were aware that he was here.  Also explained that since this was an ED, patients are seen based on life threatening illness and that we had some very sick patients.  Family states this is ridiculous, that they have been here too long. (2 hours).  Family states he will just take him somewhere else.  Explained that he should be seen soon, and that the providers were seeing patients as fast as possible.  Family was not happy.  Informed charge nurse.

## 2018-05-06 NOTE — ED Provider Notes (Signed)
[facility] EMERGENCY DEPARTMENT Provider Note   CSN: [id number] Arrival date & time: 05/06/18  1155     History   Chief Complaint Chief Complaint  Patient presents with  . Leg Pain    HPI Robert Bray is a 63 y.o. male.  The history is provided by the patient.  Leg Pain  Location:  Foot Time since incident:  1 day Injury: no   Foot location:  R foot Pain details:    Quality:  Throbbing   Radiates to:  Does not radiate   Severity:  Moderate   Onset quality:  Sudden   Duration:  1 day   Timing:  Constant   Progression:  Worsening Chronicity:  Recurrent (This is the 3rd episode.) Dislocation: no   Foreign body present:  No foreign bodies Prior injury to area:  No Relieved by:  Nothing Worsened by:  Bearing weight Ineffective treatments: tramadol. Associated symptoms: back pain and decreased ROM   Associated symptoms: no fever, no neck pain and no numbness   Risk factors comment:  Known arthritis   Past Medical History:  Diagnosis Date  . Alcohol abuse   . Chronic low back pain 2000   Reports that he has been treated by orthopedic surgeon, but was unable to continue to to lack of insurance. He has had steroid injections in the hip. He attributes his back pain to his job, which involved lifting heavy objects.  . History of cocaine use    UDS was positive for cocaine in 2011.  Marland Kitchen Smoking     Patient Active Problem List   Diagnosis Date Noted  . Muscle spasm of back 11/18/2016  . Need for hepatitis A and B vaccination 08/02/2016  . Seasonal allergic rhinitis 08/02/2016  . Helicobacter pylori gastritis (chronic gastritis) 05/01/2016  . Normocytic anemia 02/04/2016  . Achilles tendon injury 02/04/2016  . Cirrhosis of liver (HCC) 06/28/2015  . Monoallelic mutation of HFE gene 85/92/9244  . Syncope 10/02/2014  . Varix of lower extremity 04/30/2013  . Meralgia paraesthetica 04/30/2013  . Bilateral hip joint arthritis 10/24/2012  . Alcohol use  disorder, mild, abuse 10/24/2012  . History of cocaine abuse (HCC) 10/24/2012  . Tobacco abuse 10/24/2012  . Health care maintenance 10/24/2012    History reviewed. No pertinent surgical history.      Home Medications    Prior to Admission medications   Medication Sig Start Date End Date Taking? Authorizing Provider  acamprosate (CAMPRAL) 333 MG tablet Take 2 tablets (666 mg total) by mouth 3 (three) times daily. 05/05/16   Beather Arbour, MD  cetirizine (ZYRTEC) 10 MG tablet Take 1 tablet (10 mg total) by mouth daily. 08/02/16   Thomasene Lot, MD  cyclobenzaprine (FEXMID) 7.5 MG tablet Take 1 tablet (7.5 mg total) by mouth 3 (three) times daily as needed for muscle spasms. 11/28/16   Doneen Poisson, MD  diphenhydramine-acetaminophen (TYLENOL PM EXTRA STRENGTH) 25-500 MG TABS tablet Take 1 tablet by mouth at bedtime as needed. 10/15/15   Beather Arbour, MD  omeprazole (PRILOSEC) 20 MG capsule Take 1 capsule (20 mg total) by mouth daily. 08/02/16   Thomasene Lot, MD    Family History Family History  Problem Relation Age of Onset  . Hypertension Mother   . Kidney failure Mother        transplant in adult age. Unclear cause   . Hypertension Sister   . Breast cancer Sister   . Brain cancer Brother   .  Breast cancer Sister        In remission     Social History Social History   Tobacco Use  . Smoking status: Current Every Day Smoker    Packs/day: 0.25    Years: 15.00    Pack years: 3.75    Types: Cigarettes  . Smokeless tobacco: Never Used  . Tobacco comment: cutting back  Substance Use Topics  . Alcohol use: Yes    Comment: occ  . Drug use: No     Allergies   Oxycodone hcl   Review of Systems Review of Systems  Constitutional: Negative for activity change and fever.       All ROS Neg except as noted in HPI  HENT: Negative for nosebleeds.   Eyes: Negative for photophobia and discharge.  Respiratory: Negative for cough, shortness of breath and wheezing.     Cardiovascular: Negative for chest pain and palpitations.  Gastrointestinal: Negative for abdominal pain and blood in stool.  Genitourinary: Negative for dysuria, frequency and hematuria.  Musculoskeletal: Positive for arthralgias and back pain. Negative for neck pain.  Skin: Negative.   Neurological: Negative for dizziness, seizures and speech difficulty.  Psychiatric/Behavioral: Negative for confusion and hallucinations.     Physical Exam Updated Vital Signs BP 131/77 (BP Location: Right Arm)   Pulse (!) 111   Temp 97.9 F (36.6 C) (Oral)   Resp 18   Ht 6\' 2"  (1.88 m)   Wt 77.1 kg   SpO2 96%   BMI 21.83 kg/m   Physical Exam Musculoskeletal:     Comments: There is good range of motion of the right knee.  There are no temperature changes of the right lower extremity.  There is some crepitus of the right knee.  There is full range of motion of the right ankle.  The Achilles tendon is intact.  The dorsalis pedis and posterior tibial pulses are 2+.  Capillary refill is less than 3 seconds.  There is mild swelling and mild redness at the MP joint of the right big toe.  This area is tender to even light touch.  There are no lesions between the toes.  There is no evidence of any puncture wounds of the plantar surface.      ED Treatments / Results  Labs (all labs ordered are listed, but only abnormal results are displayed) Labs Reviewed - No data to display  EKG None  Radiology No results found.  Procedures Procedures (including critical care time)  Medications Ordered in ED Medications - No data to display   Initial Impression / Assessment and Plan / ED Course  I have reviewed the triage vital signs and the nursing notes.  Pertinent labs & imaging results that were available during my care of the patient were reviewed by me and considered in my medical decision making (see chart for details).      Final Clinical Impressions(s) / ED Diagnoses Vital signs reviewed.   The examination favors arthritis of the right MP joint of the big toe, probably gout arthritis.  Patient was treated in the emergency department with colchicine, morphine, and Decadron.  The patient stated that the pain was much improved.  There are no neurovascular deficits appreciated.  There is no signs of infection.  There are no signs of puncture wounds.  The patient has Ultram at his home.  We will add colchicine.  I have asked the patient to see his primary physician for additional follow-up and management as soon as possible.  Patient and family are in agreement with this plan.   Final diagnoses:  Gouty arthritis of left great toe    ED Discharge Orders         Ordered    colchicine-probenecid 0.5-500 MG tablet  Daily     05/06/18 1533           Ivery Quale, PA-C 05/06/18 1544    Vanetta Mulders, MD 05/08/18 1308

## 2018-05-06 NOTE — ED Notes (Signed)
Family states he is taking pt to his own medical doctor.  Dr. Herma Carson in hall and spoke to patient and explained the ED process again.  He informed him that someone should be in soon.  Family decided that they would stay.

## 2018-05-06 NOTE — ED Triage Notes (Signed)
C/o right LR pain x 2 days-with worse pain to foot-denies injury-NAD-to triage in w/c

## 2018-05-09 DIAGNOSIS — M5442 Lumbago with sciatica, left side: Secondary | ICD-10-CM | POA: Diagnosis not present

## 2018-05-09 DIAGNOSIS — M109 Gout, unspecified: Secondary | ICD-10-CM | POA: Diagnosis not present

## 2018-05-09 DIAGNOSIS — E7800 Pure hypercholesterolemia, unspecified: Secondary | ICD-10-CM | POA: Diagnosis not present

## 2018-05-09 DIAGNOSIS — Z79899 Other long term (current) drug therapy: Secondary | ICD-10-CM | POA: Diagnosis not present

## 2018-05-09 DIAGNOSIS — G8929 Other chronic pain: Secondary | ICD-10-CM | POA: Diagnosis not present

## 2018-05-09 DIAGNOSIS — M5441 Lumbago with sciatica, right side: Secondary | ICD-10-CM | POA: Diagnosis not present

## 2023-04-02 ENCOUNTER — Other Ambulatory Visit: Payer: Self-pay | Admitting: Urology

## 2023-04-02 DIAGNOSIS — R972 Elevated prostate specific antigen [PSA]: Secondary | ICD-10-CM

## 2023-05-31 ENCOUNTER — Ambulatory Visit
Admission: RE | Admit: 2023-05-31 | Discharge: 2023-05-31 | Disposition: A | Discharge status: Home / Self Care | Payer: Medicare HMO | Source: Ambulatory Visit | Attending: Urology

## 2023-05-31 DIAGNOSIS — R972 Elevated prostate specific antigen [PSA]: Secondary | ICD-10-CM

## 2023-05-31 MED ORDER — GADOPICLENOL 0.5 MMOL/ML IV SOLN
8.0000 mL | Freq: Once | INTRAVENOUS | Status: AC | PRN
  Administered 2023-05-31: 8 mL via INTRAVENOUS

## 2023-07-17 ENCOUNTER — Telehealth: Payer: Self-pay | Admitting: Radiation Oncology

## 2023-07-17 NOTE — Telephone Encounter (Signed)
 Spoke with patient brother to schedule patient for a consultation w. Dr. Lorri Rota. He stated patient wants surgery instead of radiation. Closing referral at this time, notified Dr. Rollie Clipper office.

## 2023-08-21 ENCOUNTER — Other Ambulatory Visit: Payer: Self-pay | Admitting: Urology

## 2023-10-31 NOTE — Patient Instructions (Signed)
 SURGICAL WAITING ROOM VISITATION Patients having surgery or a procedure may have no more than 2 support people in the waiting area - these visitors may rotate in the visitor waiting room.   Due to an increase in RSV and influenza rates and associated hospitalizations, children ages 94 and under may not visit patients in [facility] hospitals. If the patient needs to stay at the hospital during part of their recovery, the visitor guidelines for inpatient rooms apply.  PRE-OP VISITATION  Pre-op nurse will coordinate an appropriate time for 1 support person to accompany the patient in pre-op.  This support person may not rotate.  This visitor will be contacted when the time is appropriate for the visitor to come back in the pre-op area.  Please refer to the [facility] website for the visitor guidelines for Inpatients (after your surgery is over and you are in a regular room).  You are not required to quarantine at this time prior to your surgery. However, you must do this: Hand Hygiene often Do NOT share personal items Notify your provider if you are in close contact with someone who has COVID or you develop fever 100.4 or greater, new onset of sneezing, cough, sore throat, shortness of breath or body aches.  If you test positive for Covid or have been in contact with anyone that has tested positive in the last 10 days please notify you surgeon.    Your procedure is scheduled on:  11/14/23  Report to [facility] Main Entrance: [place] entrance where the [employer] is available.   Report to admitting at: 5:15 AM  Call this number if you have any questions or problems the morning of surgery [phone]  FOLLOW ANY ADDITIONAL PRE OP INSTRUCTIONS YOU RECEIVED FROM YOUR SURGEON'S OFFICE!!! : BOWEL PREP.  Clear liquids starting the day before until : 4:30 AM DAY OF SURGERY  Clear Liquid Diet Water Black Coffee (sugar ok, NO MILK/CREAM OR CREAMERS)  Tea (sugar ok, NO MILK/CREAM OR  CREAMERS) regular and decaf                             Plain Jell-O  with no fruit (NO RED)                                           Fruit ices (not with fruit pulp, NO RED)                                     Popsicles (NO RED)                                                                  Juice: NO CITRUS JUICES: only apple, WHITE grape, WHITE cranberry Sports drinks like Gatorade or Powerade (NO RED)   Oral Hygiene is also important to reduce your risk of infection.        Remember - BRUSH YOUR TEETH THE MORNING OF SURGERY WITH YOUR REGULAR TOOTHPASTE  Do NOT smoke after Midnight the night before surgery.  STOP TAKING all Vitamins, Herbs and supplements 1 week before your surgery.   Take ONLY these medicines the morning of surgery with A SIP OF WATER: cetirizine ,colchicine ,omeprazole ,acamprosate (campral ).  If You have been diagnosed with Sleep Apnea - Bring CPAP mask and tubing day of surgery. We will provide you with a CPAP machine on the day of your surgery.                   You may not have any metal on your body including hair pins, jewelry, and body piercing  Do not wear lotions, powders, perfumes / cologne, or deodorant  Men may shave face and neck.  Contacts, Hearing Aids, dentures or bridgework may not be worn into surgery. DENTURES WILL BE REMOVED PRIOR TO SURGERY PLEASE DO NOT APPLY Poly grip OR ADHESIVES!!!  You may bring a small overnight bag with you on the day of surgery, only pack items that are not valuable. [facility] IS NOT RESPONSIBLE   FOR VALUABLES THAT ARE LOST OR STOLEN.   Patients discharged on the day of surgery will not be allowed to drive home.  Someone NEEDS to stay with you for the first 24 hours after anesthesia.  Do not bring your home medications to the hospital. The Pharmacy will dispense medications listed on your medication list to you during your admission in the Hospital.  Special Instructions: Bring a copy of your healthcare power of  attorney and living will documents the day of surgery, if you wish to have them scanned into your [facility] Records- EPIC  Please read over the following fact sheets you were given: IF YOU HAVE QUESTIONS ABOUT YOUR PRE-OP INSTRUCTIONS, PLEASE CALL [phone]   [facility] - Preparing for Surgery Before surgery, you can play an important role.  Because skin is not sterile, your skin needs to be as free of germs as possible.  You can reduce the number of germs on your skin by washing with CHG (chlorahexidine gluconate) soap before surgery.  CHG is an antiseptic cleaner which kills germs and bonds with the skin to continue killing germs even after washing. Please DO NOT use if you have an allergy to CHG or antibacterial soaps.  If your skin becomes reddened/irritated stop using the CHG and inform your nurse when you arrive at Short Stay. Do not shave (including legs and underarms) for at least 48 hours prior to the first CHG shower.  You may shave your face/neck.  Please follow these instructions carefully:  1.  Shower with CHG Soap the night before surgery and the  morning of surgery.  2.  If you choose to wash your hair, wash your hair first as usual with your normal  shampoo.  3.  After you shampoo, rinse your hair and body thoroughly to remove the shampoo.                             4.  Use CHG as you would any other liquid soap.  You can apply chg directly to the skin and wash.  Gently with a scrungie or clean washcloth.  5.  Apply the CHG Soap to your body ONLY FROM THE NECK DOWN.   Do not use on face/ open                           Wound or open sores. Avoid contact with eyes, ears mouth and  genitals (private parts).                       Wash face,  Genitals (private parts) with your normal soap.             6.  Wash thoroughly, paying special attention to the area where your  surgery  will be performed.  7.  Thoroughly rinse your body with warm water from the neck down.  8.  DO  NOT shower/wash with your normal soap after using and rinsing off the CHG Soap.            9.  Pat yourself dry with a clean towel.            10.  Wear clean pajamas.            11.  Place clean sheets on your bed the night of your first shower and do not  sleep with pets.  ON THE DAY OF SURGERY : Do not apply any lotions/deodorants the morning of surgery.  Please wear clean clothes to the hospital/surgery center.     FAILURE TO FOLLOW THESE INSTRUCTIONS MAY RESULT IN THE CANCELLATION OF YOUR SURGERY  PATIENT SIGNATURE_________________________________  NURSE SIGNATURE__________________________________  ________________________________________________________________________   WHAT IS A BLOOD TRANSFUSION? Blood Transfusion Information  A transfusion is the replacement of blood or some of its parts. Blood is made up of multiple cells which provide different functions. Red blood cells carry oxygen and are used for blood loss replacement. White blood cells fight against infection. Platelets control bleeding. Plasma helps clot blood. Other blood products are available for specialized needs, such as hemophilia or other clotting disorders. BEFORE THE TRANSFUSION  Who gives blood for transfusions?  Healthy volunteers who are fully evaluated to make sure their blood is safe. This is blood bank blood. Transfusion therapy is the safest it has ever been in the practice of medicine. Before blood is taken from a donor, a complete history is taken to make sure that person has no history of diseases nor engages in risky social behavior (examples are intravenous drug use or sexual activity with multiple partners). The donor's travel history is screened to minimize risk of transmitting infections, such as malaria. The donated blood is tested for signs of infectious diseases, such as HIV and hepatitis. The blood is then tested to be sure it is compatible with you in order to minimize the chance of a  transfusion reaction. If you or a relative donates blood, this is often done in anticipation of surgery and is not appropriate for emergency situations. It takes many days to process the donated blood. RISKS AND COMPLICATIONS Although transfusion therapy is very safe and saves many lives, the main dangers of transfusion include:  Getting an infectious disease. Developing a transfusion reaction. This is an allergic reaction to something in the blood you were given. Every precaution is taken to prevent this. The decision to have a blood transfusion has been considered carefully by your caregiver before blood is given. Blood is not given unless the benefits outweigh the risks. AFTER THE TRANSFUSION Right after receiving a blood transfusion, you will usually feel much better and more energetic. This is especially true if your red blood cells have gotten low (anemic). The transfusion raises the level of the red blood cells which carry oxygen, and this usually causes an energy increase. The nurse administering the transfusion will monitor you carefully for complications. HOME CARE INSTRUCTIONS  No special  instructions are needed after a transfusion. You may find your energy is better. Speak with your caregiver about any limitations on activity for underlying diseases you may have. SEEK MEDICAL CARE IF:  Your condition is not improving after your transfusion. You develop redness or irritation at the intravenous (IV) site. SEEK IMMEDIATE MEDICAL CARE IF:  Any of the following symptoms occur over the next 12 hours: Shaking chills. You have a temperature by mouth above 102 F (38.9 C), not controlled by medicine. Chest, back, or muscle pain. People around you feel you are not acting correctly or are confused. Shortness of breath or difficulty breathing. Dizziness and fainting. You get a rash or develop hives. You have a decrease in urine output. Your urine turns a dark color or changes to pink, red,  or brown. Any of the following symptoms occur over the next 10 days: You have a temperature by mouth above 102 F (38.9 C), not controlled by medicine. Shortness of breath. Weakness after normal activity. The white part of the eye turns yellow (jaundice). You have a decrease in the amount of urine or are urinating less often. Your urine turns a dark color or changes to pink, red, or brown. Document Released: 03/03/2000 Document Revised: 05/29/2011 Document Reviewed: 10/21/2007 [facility] Patient Information 2014 [place], [place].  _______________________________________________________________________

## 2023-11-01 ENCOUNTER — Other Ambulatory Visit: Payer: Self-pay

## 2023-11-01 ENCOUNTER — Encounter (HOSPITAL_COMMUNITY)
Admission: RE | Admit: 2023-11-01 | Discharge: 2023-11-01 | Disposition: A | Discharge status: Home / Self Care | Source: Ambulatory Visit | Attending: Urology | Admitting: Urology

## 2023-11-01 ENCOUNTER — Encounter (HOSPITAL_COMMUNITY): Payer: Self-pay

## 2023-11-01 DIAGNOSIS — Z01812 Encounter for preprocedural laboratory examination: Secondary | ICD-10-CM | POA: Insufficient documentation

## 2023-11-01 HISTORY — DX: Unspecified osteoarthritis, unspecified site: M19.90

## 2023-11-01 HISTORY — DX: Dyspnea, unspecified: R06.00

## 2023-11-01 HISTORY — DX: Malignant (primary) neoplasm, unspecified: C80.1

## 2023-11-01 HISTORY — DX: Chronic obstructive pulmonary disease, unspecified: J44.9

## 2023-11-01 LAB — BASIC METABOLIC PANEL WITH GFR
Anion gap: 9 (ref 5–15)
BUN: 21 mg/dL (ref 8–23)
CO2: 26 mmol/L (ref 22–32)
Calcium: 9.7 mg/dL (ref 8.9–10.3)
Chloride: 104 mmol/L (ref 98–111)
Creatinine, Ser: 0.89 mg/dL (ref 0.61–1.24)
GFR, Estimated: >60 mL/min (ref >60)
Glucose, Bld: 128 mg/dL — ABNORMAL HIGH (ref 70–99)
Potassium: 3.7 mmol/L (ref 3.5–5.1)
Sodium: 139 mmol/L (ref 135–145)

## 2023-11-01 LAB — CBC
HCT: 38.4 % — ABNORMAL LOW (ref 39.0–52.0)
Hemoglobin: 12.5 g/dL — ABNORMAL LOW (ref 13.0–17.0)
MCH: 26.2 pg (ref 26.0–34.0)
MCHC: 32.6 g/dL (ref 30.0–36.0)
MCV: 80.3 fL (ref 80.0–100.0)
Platelets: 158 K/uL (ref 150–400)
RBC: 4.78 MIL/uL (ref 4.22–5.81)
RDW: 14.9 % (ref 11.5–15.5)
WBC: 8.3 K/uL (ref 4.0–10.5)
nRBC: 0 % (ref 0.0–0.2)

## 2023-11-01 LAB — TYPE AND SCREEN
ABO/RH(D): B POS
Antibody Screen: NEGATIVE

## 2023-11-01 NOTE — Progress Notes (Signed)
 For Anesthesia: PCP - [facility]  Cardiologist - N/A  Bowel Prep reminder:  Chest x-ray -  EKG -  Stress Test -  ECHO -  Cardiac Cath -  Pacemaker/ICD device last checked: Pacemaker orders received: Device Rep notified:  Spinal Cord Stimulator:N/A  Sleep Study - N/A CPAP -   Fasting Blood Sugar - N/A Checks Blood Sugar _____ times a day Date and result of last Hgb A1c-  Last dose of GLP1 agonist- N/A GLP1 instructions:   Last dose of SGLT-2 inhibitors- N/A SGLT-2 instructions:   Blood Thinner Instructions:N/A Aspirin Instructions: Last Dose:  Activity level:    Unable to go up a flight of stairs without shortness of breath     Anesthesia review: Hx: Smoker.  Patient denies shortness of breath, fever, cough and chest pain at PAT appointment   Patient verbalized understanding of instructions that were reviewed over the telephone.

## 2023-11-02 LAB — URINE CULTURE: Culture: NO GROWTH

## 2023-11-13 NOTE — Anesthesia Preprocedure Evaluation (Addendum)
 Anesthesia Evaluation  Patient identified by MRN, date of birth, ID band Patient awake    Reviewed: Allergy & Precautions, NPO status , Patient's Chart, lab work & pertinent test results  History of Anesthesia Complications Negative for: history of anesthetic complications  Airway Mallampati: II  TM Distance: >3 FB Neck ROM: Full    Dental no notable dental hx. (+) Missing, Dental Advisory Given, Teeth Intact,    Pulmonary shortness of breath, COPD,  COPD inhaler, Current SmokerPatient did not abstain from smoking.   Pulmonary exam normal breath sounds clear to auscultation       Cardiovascular (-) hypertension(-) angina (-) Past MI negative cardio ROS Normal cardiovascular exam Rhythm:Regular Rate:Normal     Neuro/Psych  PSYCHIATRIC DISORDERS       Neuromuscular disease    GI/Hepatic ,GERD  Medicated and Controlled,,  Endo/Other    Renal/GU Lab Results      Component                Value               Date                      NA                       139                 11/01/2023                CL                       104                 11/01/2023                K                        3.7                 11/01/2023                CO2                      26                  11/01/2023                BUN                      21                  11/01/2023                CREATININE               0.89                11/01/2023                GFRNONAA                 >60                 11/01/2023                CALCIUM   9.7                 11/01/2023                ALBUMIN                  4.5                 03/26/2015                GLUCOSE                  128 (H)             11/01/2023              Prostate CA    Musculoskeletal  (+) Arthritis ,  gout   Abdominal   Peds  Hematology Lab Results      Component                Value               Date                      WBC                       8.3                 11/01/2023                HGB                      12.5 (L)            11/01/2023                HCT                      38.4 (L)            11/01/2023                MCV                      80.3                11/01/2023                PLT                      158                 11/01/2023              Anesthesia Other Findings All: oxycodone   Reproductive/Obstetrics                              Anesthesia Physical Anesthesia Plan  ASA: 3  Anesthesia Plan: General   Post-op Pain Management: Lidocaine  infusion*, Precedex  and Ketamine IV*   Induction: Intravenous  PONV Risk Score and Plan: 3 and Treatment may vary due to age or medical condition, Ondansetron , Midazolam and Dexamethasone   Airway Management Planned: Oral ETT  Additional Equipment: None  Intra-op Plan:   Post-operative Plan: Extubation in OR  Informed Consent: I have reviewed the patients History and Physical, chart, labs and discussed the procedure including the risks, benefits and  alternatives for the proposed anesthesia with the patient or authorized representative who has indicated his/her understanding and acceptance.     Dental advisory given  Plan Discussed with: CRNA and Surgeon  Anesthesia Plan Comments:          Anesthesia Quick Evaluation

## 2023-11-14 ENCOUNTER — Ambulatory Visit (HOSPITAL_BASED_OUTPATIENT_CLINIC_OR_DEPARTMENT_OTHER): Admitting: Anesthesiology

## 2023-11-14 ENCOUNTER — Encounter (HOSPITAL_COMMUNITY)
Admission: RE | Disposition: A | Discharge status: Home / Self Care | Payer: Self-pay | Source: Home / Self Care | Attending: Urology

## 2023-11-14 ENCOUNTER — Ambulatory Visit (HOSPITAL_COMMUNITY): Payer: Self-pay | Admitting: Medical

## 2023-11-14 ENCOUNTER — Other Ambulatory Visit: Payer: Self-pay

## 2023-11-14 ENCOUNTER — Observation Stay (HOSPITAL_COMMUNITY)
Admission: RE | Admit: 2023-11-14 | Discharge: 2023-11-16 | Disposition: A | Discharge status: Home / Self Care | Attending: Urology | Admitting: Urology

## 2023-11-14 ENCOUNTER — Encounter (HOSPITAL_COMMUNITY): Payer: Self-pay | Admitting: Urology

## 2023-11-14 DIAGNOSIS — F17200 Nicotine dependence, unspecified, uncomplicated: Secondary | ICD-10-CM

## 2023-11-14 DIAGNOSIS — J449 Chronic obstructive pulmonary disease, unspecified: Secondary | ICD-10-CM

## 2023-11-14 DIAGNOSIS — Z7951 Long term (current) use of inhaled steroids: Secondary | ICD-10-CM | POA: Insufficient documentation

## 2023-11-14 DIAGNOSIS — I10 Essential (primary) hypertension: Secondary | ICD-10-CM | POA: Insufficient documentation

## 2023-11-14 DIAGNOSIS — F1090 Alcohol use, unspecified, uncomplicated: Secondary | ICD-10-CM | POA: Diagnosis not present

## 2023-11-14 DIAGNOSIS — C61 Malignant neoplasm of prostate: Principal | ICD-10-CM | POA: Insufficient documentation

## 2023-11-14 DIAGNOSIS — F17210 Nicotine dependence, cigarettes, uncomplicated: Secondary | ICD-10-CM | POA: Diagnosis not present

## 2023-11-14 DIAGNOSIS — Z79899 Other long term (current) drug therapy: Secondary | ICD-10-CM | POA: Insufficient documentation

## 2023-11-14 DIAGNOSIS — J45998 Other asthma: Secondary | ICD-10-CM | POA: Insufficient documentation

## 2023-11-14 HISTORY — PX: ROBOT ASSISTED LAPAROSCOPIC RADICAL PROSTATECTOMY: SHX5141

## 2023-11-14 LAB — HEMOGLOBIN AND HEMATOCRIT, BLOOD
HCT: 33.4 % — ABNORMAL LOW (ref 39.0–52.0)
Hemoglobin: 10.3 g/dL — ABNORMAL LOW (ref 13.0–17.0)

## 2023-11-14 LAB — ABO/RH: ABO/RH(D): B POS

## 2023-11-14 SURGERY — PROSTATECTOMY, RADICAL, ROBOT-ASSISTED, LAPAROSCOPIC
Anesthesia: General | Site: Abdomen

## 2023-11-14 MED ORDER — DEXAMETHASONE SODIUM PHOSPHATE 10 MG/ML IJ SOLN
INTRAMUSCULAR | Status: AC
  Filled 2023-11-14: qty 1

## 2023-11-14 MED ORDER — DIPHENHYDRAMINE HCL 50 MG/ML IJ SOLN: 12.5000 mg | Freq: Four times a day (QID) | INTRAMUSCULAR | Status: DC | PRN

## 2023-11-14 MED ORDER — PHENOL 1.4 % MT LIQD: 1.0000 | OROMUCOSAL | Status: DC | PRN

## 2023-11-14 MED ORDER — ROCURONIUM BROMIDE 10 MG/ML (PF) SYRINGE
PREFILLED_SYRINGE | INTRAVENOUS | Status: AC
  Filled 2023-11-14: qty 10

## 2023-11-14 MED ORDER — PHENYLEPHRINE HCL-NACL 20-0.9 MG/250ML-% IV SOLN
INTRAVENOUS | Status: DC | PRN
  Administered 2023-11-14: 50 ug/min via INTRAVENOUS

## 2023-11-14 MED ORDER — EPHEDRINE 5 MG/ML INJ
INTRAVENOUS | Status: AC
  Filled 2023-11-14: qty 5

## 2023-11-14 MED ORDER — EPHEDRINE SULFATE-NACL 50-0.9 MG/10ML-% IV SOSY
PREFILLED_SYRINGE | INTRAVENOUS | Status: DC | PRN
  Administered 2023-11-14 (×3): 5 mg via INTRAVENOUS

## 2023-11-14 MED ORDER — DOCUSATE SODIUM 100 MG PO CAPS: 100.0000 mg | ORAL_CAPSULE | Freq: Two times a day (BID) | ORAL | Status: AC

## 2023-11-14 MED ORDER — FENTANYL CITRATE (PF) 100 MCG/2ML IJ SOLN
INTRAMUSCULAR | Status: DC | PRN
  Administered 2023-11-14 (×2): 50 ug via INTRAVENOUS

## 2023-11-14 MED ORDER — ONDANSETRON HCL 4 MG/2ML IJ SOLN: 4.0000 mg | Freq: Once | INTRAMUSCULAR | Status: DC | PRN

## 2023-11-14 MED ORDER — ALLOPURINOL 100 MG PO TABS
300.0000 mg | ORAL_TABLET | Freq: Every day | ORAL | Status: DC
  Administered 2023-11-14 – 2023-11-16 (×3): 300 mg via ORAL
  Filled 2023-11-14 (×3): qty 3

## 2023-11-14 MED ORDER — LACTATED RINGERS IV SOLN: INTRAVENOUS | Status: DC

## 2023-11-14 MED ORDER — DIPHENHYDRAMINE HCL 12.5 MG/5ML PO ELIX: 12.5000 mg | ORAL_SOLUTION | Freq: Four times a day (QID) | ORAL | Status: DC | PRN

## 2023-11-14 MED ORDER — FLUTICASONE FUROATE-VILANTEROL 200-25 MCG/ACT IN AEPB
1.0000 | INHALATION_SPRAY | Freq: Every day | RESPIRATORY_TRACT | Status: DC
  Administered 2023-11-15 – 2023-11-16 (×2): 1 via RESPIRATORY_TRACT
  Filled 2023-11-14: qty 28

## 2023-11-14 MED ORDER — LIDOCAINE HCL (PF) 2 % IJ SOLN
INTRAMUSCULAR | Status: AC
  Filled 2023-11-14: qty 10

## 2023-11-14 MED ORDER — ONDANSETRON HCL 4 MG/2ML IJ SOLN: 4.0000 mg | INTRAMUSCULAR | Status: DC | PRN

## 2023-11-14 MED ORDER — BUPIVACAINE-EPINEPHRINE (PF) 0.25% -1:200000 IJ SOLN
INTRAMUSCULAR | Status: AC
  Filled 2023-11-14: qty 30

## 2023-11-14 MED ORDER — SUGAMMADEX SODIUM 200 MG/2ML IV SOLN
INTRAVENOUS | Status: AC
  Filled 2023-11-14: qty 2

## 2023-11-14 MED ORDER — HYDROMORPHONE HCL 1 MG/ML IJ SOLN: 0.2500 mg | INTRAMUSCULAR | Status: DC | PRN

## 2023-11-14 MED ORDER — CIPROFLOXACIN IN D5W 400 MG/200ML IV SOLN
400.0000 mg | INTRAVENOUS | Status: AC
  Administered 2023-11-14: 400 mg via INTRAVENOUS
  Filled 2023-11-14: qty 200

## 2023-11-14 MED ORDER — SODIUM CHLORIDE 0.45 % IV SOLN: INTRAVENOUS | Status: AC

## 2023-11-14 MED ORDER — DOCUSATE SODIUM 100 MG PO CAPS
100.0000 mg | ORAL_CAPSULE | Freq: Two times a day (BID) | ORAL | Status: DC
  Administered 2023-11-14 – 2023-11-16 (×4): 100 mg via ORAL
  Filled 2023-11-14 (×4): qty 1

## 2023-11-14 MED ORDER — FLUTICASONE FUROATE-VILANTEROL 200-25 MCG/ACT IN AEPB
1.0000 | INHALATION_SPRAY | Freq: Every day | RESPIRATORY_TRACT | Status: DC
  Filled 2023-11-14: qty 28

## 2023-11-14 MED ORDER — HYDROMORPHONE HCL 1 MG/ML IJ SOLN
0.5000 mg | INTRAMUSCULAR | Status: DC | PRN
  Administered 2023-11-15 – 2023-11-16 (×2): 1 mg via INTRAVENOUS
  Filled 2023-11-14 (×2): qty 1

## 2023-11-14 MED ORDER — LIDOCAINE HCL (CARDIAC) PF 100 MG/5ML IV SOSY
PREFILLED_SYRINGE | INTRAVENOUS | Status: DC | PRN
Start: 2023-11-14 — End: 2023-11-14
  Administered 2023-11-14: 60 mg via INTRAVENOUS

## 2023-11-14 MED ORDER — ONDANSETRON HCL 4 MG/2ML IJ SOLN
INTRAMUSCULAR | Status: AC
  Filled 2023-11-14: qty 2

## 2023-11-14 MED ORDER — ALBUTEROL SULFATE HFA 108 (90 BASE) MCG/ACT IN AERS: 2.0000 | INHALATION_SPRAY | RESPIRATORY_TRACT | Status: DC | PRN

## 2023-11-14 MED ORDER — MAGIC MOUTHWASH
5.0000 mL | Freq: Three times a day (TID) | ORAL | Status: DC
  Administered 2023-11-14 – 2023-11-16 (×4): 5 mL via ORAL
  Filled 2023-11-14 (×7): qty 5

## 2023-11-14 MED ORDER — KETOROLAC TROMETHAMINE 15 MG/ML IJ SOLN
15.0000 mg | Freq: Four times a day (QID) | INTRAMUSCULAR | Status: DC
  Administered 2023-11-14 – 2023-11-15 (×3): 15 mg via INTRAVENOUS
  Filled 2023-11-14 (×3): qty 1

## 2023-11-14 MED ORDER — STERILE WATER FOR IRRIGATION IR SOLN
Status: DC | PRN
Start: 2023-11-14 — End: 2023-11-14
  Administered 2023-11-14: 1000 mL

## 2023-11-14 MED ORDER — BUPIVACAINE-EPINEPHRINE 0.5% -1:200000 IJ SOLN
INTRAMUSCULAR | Status: DC | PRN
  Administered 2023-11-14: 15 mL

## 2023-11-14 MED ORDER — TRIPLE ANTIBIOTIC 3.5-400-5000 EX OINT: 1.0000 | TOPICAL_OINTMENT | Freq: Three times a day (TID) | CUTANEOUS | Status: DC | PRN

## 2023-11-14 MED ORDER — OXYCODONE HCL 5 MG/5ML PO SOLN: 5.0000 mg | Freq: Once | ORAL | Status: DC | PRN

## 2023-11-14 MED ORDER — CHLORHEXIDINE GLUCONATE 0.12 % MT SOLN
15.0000 mL | Freq: Once | OROMUCOSAL | Status: AC
  Administered 2023-11-14: 15 mL via OROMUCOSAL

## 2023-11-14 MED ORDER — SPIRITUS FRUMENTI
1.0000 | Freq: Three times a day (TID) | ORAL | Status: DC
  Administered 2023-11-14: 1 via ORAL
  Filled 2023-11-14 (×7): qty 1

## 2023-11-14 MED ORDER — ACETAMINOPHEN 10 MG/ML IV SOLN
1000.0000 mg | Freq: Four times a day (QID) | INTRAVENOUS | Status: AC
  Administered 2023-11-14 – 2023-11-15 (×4): 1000 mg via INTRAVENOUS
  Filled 2023-11-14 (×4): qty 100

## 2023-11-14 MED ORDER — LIDOCAINE HCL (PF) 2 % IJ SOLN
INTRAMUSCULAR | Status: AC
  Filled 2023-11-14: qty 5

## 2023-11-14 MED ORDER — PHENYLEPHRINE 80 MCG/ML (10ML) SYRINGE FOR IV PUSH (FOR BLOOD PRESSURE SUPPORT)
PREFILLED_SYRINGE | INTRAVENOUS | Status: AC
  Filled 2023-11-14: qty 30

## 2023-11-14 MED ORDER — LIDOCAINE HCL (PF) 2 % IJ SOLN
INTRAMUSCULAR | Status: DC | PRN
  Administered 2023-11-14: 1 mg/kg/h

## 2023-11-14 MED ORDER — BUPIVACAINE-EPINEPHRINE (PF) 0.5% -1:200000 IJ SOLN
INTRAMUSCULAR | Status: AC
  Filled 2023-11-14: qty 30

## 2023-11-14 MED ORDER — DEXMEDETOMIDINE HCL IN NACL 80 MCG/20ML IV SOLN
INTRAVENOUS | Status: AC
  Filled 2023-11-14: qty 20

## 2023-11-14 MED ORDER — PHENYLEPHRINE HCL-NACL 20-0.9 MG/250ML-% IV SOLN
INTRAVENOUS | Status: AC
  Filled 2023-11-14: qty 250

## 2023-11-14 MED ORDER — TRAMADOL HCL 50 MG PO TABS: 50.0000 mg | ORAL_TABLET | Freq: Four times a day (QID) | ORAL | 0 refills | Status: AC | PRN

## 2023-11-14 MED ORDER — METHOCARBAMOL 500 MG PO TABS
500.0000 mg | ORAL_TABLET | Freq: Every day | ORAL | Status: DC
  Administered 2023-11-14 – 2023-11-16 (×3): 500 mg via ORAL
  Filled 2023-11-14 (×3): qty 1

## 2023-11-14 MED ORDER — BUPIVACAINE LIPOSOME 1.3 % IJ SUSP
INTRAMUSCULAR | Status: AC
  Filled 2023-11-14: qty 20

## 2023-11-14 MED ORDER — SODIUM CHLORIDE 0.9 % IV BOLUS
500.0000 mL | Freq: Once | INTRAVENOUS | Status: AC
  Administered 2023-11-14: 500 mL via INTRAVENOUS

## 2023-11-14 MED ORDER — CEFAZOLIN SODIUM-DEXTROSE 2-4 GM/100ML-% IV SOLN
2.0000 g | INTRAVENOUS | Status: AC
  Administered 2023-11-14: 2 g via INTRAVENOUS
  Filled 2023-11-14: qty 100

## 2023-11-14 MED ORDER — FENTANYL CITRATE (PF) 100 MCG/2ML IJ SOLN
INTRAMUSCULAR | Status: AC
  Filled 2023-11-14: qty 2

## 2023-11-14 MED ORDER — DEXMEDETOMIDINE HCL IN NACL 80 MCG/20ML IV SOLN
INTRAVENOUS | Status: DC | PRN
  Administered 2023-11-14: 8 ug via INTRAVENOUS

## 2023-11-14 MED ORDER — SUGAMMADEX SODIUM 200 MG/2ML IV SOLN
INTRAVENOUS | Status: DC | PRN
Start: 2023-11-14 — End: 2023-11-14
  Administered 2023-11-14: 200 mg via INTRAVENOUS

## 2023-11-14 MED ORDER — HYOSCYAMINE SULFATE 0.125 MG SL SUBL: 0.1250 mg | SUBLINGUAL_TABLET | SUBLINGUAL | Status: DC | PRN

## 2023-11-14 MED ORDER — FENTANYL CITRATE PF 50 MCG/ML IJ SOSY
PREFILLED_SYRINGE | INTRAMUSCULAR | Status: AC
  Filled 2023-11-14: qty 1

## 2023-11-14 MED ORDER — BUPIVACAINE-EPINEPHRINE (PF) 0.5% -1:200000 IJ SOLN
INTRAMUSCULAR | Status: DC | PRN
  Administered 2023-11-14: 35 mL

## 2023-11-14 MED ORDER — SODIUM CHLORIDE 0.9 % IV BOLUS
1000.0000 mL | Freq: Once | INTRAVENOUS | Status: AC
  Administered 2023-11-14: 1000 mL via INTRAVENOUS

## 2023-11-14 MED ORDER — PANTOPRAZOLE SODIUM 40 MG PO TBEC
40.0000 mg | DELAYED_RELEASE_TABLET | Freq: Every day | ORAL | Status: DC
  Administered 2023-11-14 – 2023-11-16 (×3): 40 mg via ORAL
  Filled 2023-11-14 (×3): qty 1

## 2023-11-14 MED ORDER — MOMETASONE FURO-FORMOTEROL FUM 200-5 MCG/ACT IN AERO
2.0000 | INHALATION_SPRAY | Freq: Two times a day (BID) | RESPIRATORY_TRACT | Status: DC
  Filled 2023-11-14: qty 8.8

## 2023-11-14 MED ORDER — OXYCODONE HCL 5 MG PO TABS: 5.0000 mg | ORAL_TABLET | Freq: Once | ORAL | Status: DC | PRN

## 2023-11-14 MED ORDER — TRAMADOL HCL 50 MG PO TABS
50.0000 mg | ORAL_TABLET | Freq: Four times a day (QID) | ORAL | Status: DC | PRN
  Administered 2023-11-16: 50 mg via ORAL
  Filled 2023-11-14: qty 1

## 2023-11-14 MED ORDER — ROCURONIUM BROMIDE 100 MG/10ML IV SOLN
INTRAVENOUS | Status: DC | PRN
Start: 2023-11-14 — End: 2023-11-14
  Administered 2023-11-14: 20 mg via INTRAVENOUS
  Administered 2023-11-14: 50 mg via INTRAVENOUS
  Administered 2023-11-14 (×2): 15 mg via INTRAVENOUS

## 2023-11-14 MED ORDER — HEMOSTATIC AGENTS (NO CHARGE) OPTIME
TOPICAL | Status: DC | PRN
  Administered 2023-11-14: 1 via TOPICAL

## 2023-11-14 MED ORDER — PROPOFOL 10 MG/ML IV BOLUS
INTRAVENOUS | Status: DC | PRN
  Administered 2023-11-14: 100 mg via INTRAVENOUS

## 2023-11-14 MED ORDER — ORAL CARE MOUTH RINSE: 15.0000 mL | Freq: Once | OROMUCOSAL | Status: AC

## 2023-11-14 MED ORDER — FENTANYL CITRATE PF 50 MCG/ML IJ SOSY
25.0000 ug | PREFILLED_SYRINGE | INTRAMUSCULAR | Status: DC | PRN
  Administered 2023-11-14: 25 ug via INTRAVENOUS

## 2023-11-14 MED ORDER — BOOST / RESOURCE BREEZE PO LIQD CUSTOM
1.0000 | Freq: Three times a day (TID) | ORAL | Status: DC
  Administered 2023-11-14 – 2023-11-15 (×2): 1 via ORAL

## 2023-11-14 MED ORDER — PHENYLEPHRINE 80 MCG/ML (10ML) SYRINGE FOR IV PUSH (FOR BLOOD PRESSURE SUPPORT)
PREFILLED_SYRINGE | INTRAVENOUS | Status: DC | PRN
  Administered 2023-11-14: 160 ug via INTRAVENOUS
  Administered 2023-11-14: 120 ug via INTRAVENOUS
  Administered 2023-11-14: 160 ug via INTRAVENOUS
  Administered 2023-11-14: 80 ug via INTRAVENOUS
  Administered 2023-11-14: 160 ug via INTRAVENOUS

## 2023-11-14 MED ORDER — SULFAMETHOXAZOLE-TRIMETHOPRIM 800-160 MG PO TABS: 1.0000 | ORAL_TABLET | Freq: Two times a day (BID) | ORAL | 0 refills | Status: AC

## 2023-11-14 MED ORDER — DEXAMETHASONE SODIUM PHOSPHATE 10 MG/ML IJ SOLN
INTRAMUSCULAR | Status: DC | PRN
  Administered 2023-11-14: 8 mg via INTRAVENOUS

## 2023-11-14 MED ORDER — ONDANSETRON HCL 4 MG/2ML IJ SOLN
INTRAMUSCULAR | Status: DC | PRN
  Administered 2023-11-14: 4 mg via INTRAVENOUS

## 2023-11-14 MED ORDER — MENTHOL 3 MG MT LOZG: 1.0000 | LOZENGE | OROMUCOSAL | Status: DC | PRN

## 2023-11-14 MED ORDER — ALBUTEROL SULFATE (2.5 MG/3ML) 0.083% IN NEBU: 2.5000 mg | INHALATION_SOLUTION | RESPIRATORY_TRACT | Status: DC | PRN

## 2023-11-14 MED ORDER — PROPOFOL 10 MG/ML IV BOLUS
INTRAVENOUS | Status: AC
  Filled 2023-11-14: qty 20

## 2023-11-14 SURGICAL SUPPLY — 51 items
APPLICATOR COTTON TIP 6 STRL (MISCELLANEOUS) ×2 IMPLANT
BAG COUNTER SPONGE SURGICOUNT (BAG) IMPLANT
CATH FOLEY 2WAY SLVR 18FR 30CC (CATHETERS) ×2 IMPLANT
CATH TIEMANN FOLEY 18FR 5CC (CATHETERS) ×2 IMPLANT
CHLORAPREP W/TINT 26 (MISCELLANEOUS) ×2 IMPLANT
CLIP LIGATING HEM O LOK PURPLE (MISCELLANEOUS) ×2 IMPLANT
COVER SURGICAL LIGHT HANDLE (MISCELLANEOUS) ×2 IMPLANT
COVER TIP SHEARS 8 DVNC (MISCELLANEOUS) ×2 IMPLANT
CUTTER ECHEON FLEX ENDO 45 340 (ENDOMECHANICALS) ×2 IMPLANT
DERMABOND ADVANCED .7 DNX12 (GAUZE/BANDAGES/DRESSINGS) ×2 IMPLANT
DRAPE ARM DVNC X/XI (DISPOSABLE) ×8 IMPLANT
DRAPE COLUMN DVNC XI (DISPOSABLE) ×2 IMPLANT
DRAPE SURG IRRIG POUCH 19X23 (DRAPES) ×2 IMPLANT
DRIVER NDL LRG 8 DVNC XI (INSTRUMENTS) ×4 IMPLANT
DRIVER NDLE LRG 8 DVNC XI (INSTRUMENTS) ×4 IMPLANT
DRSG TEGADERM 4X4.75 (GAUZE/BANDAGES/DRESSINGS) ×2 IMPLANT
ELECT PENCIL ROCKER SW 15FT (MISCELLANEOUS) ×2 IMPLANT
ELECT REM PT RETURN 15FT ADLT (MISCELLANEOUS) ×2 IMPLANT
FORCEPS BPLR LNG DVNC XI (INSTRUMENTS) ×2 IMPLANT
FORCEPS PROGRASP DVNC XI (FORCEP) ×2 IMPLANT
GAUZE 4X4 16PLY ~~LOC~~+RFID DBL (SPONGE) IMPLANT
GAUZE SPONGE 2X2 8PLY STRL LF (GAUZE/BANDAGES/DRESSINGS) IMPLANT
GAUZE SPONGE 4X4 12PLY STRL (GAUZE/BANDAGES/DRESSINGS) ×2 IMPLANT
GLOVE BIO SURGEON STRL SZ 6.5 (GLOVE) ×2 IMPLANT
GLOVE SURG LX STRL 7.5 STRW (GLOVE) ×4 IMPLANT
GOWN STRL REUS W/ TWL XL LVL3 (GOWN DISPOSABLE) ×4 IMPLANT
GOWN STRL SURGICAL XL XLNG (GOWN DISPOSABLE) ×2 IMPLANT
HEMOSTAT SURGICEL 4X8 (HEMOSTASIS) IMPLANT
HOLDER FOLEY CATH W/STRAP (MISCELLANEOUS) ×2 IMPLANT
IRRIGATION SUCT STRKRFLW 2 WTP (MISCELLANEOUS) ×2 IMPLANT
IV LACTATED RINGERS 1000ML (IV SOLUTION) ×2 IMPLANT
KIT TURNOVER KIT A (KITS) ×2 IMPLANT
PACK ROBOT UROLOGY CUSTOM (CUSTOM PROCEDURE TRAY) ×2 IMPLANT
PAD POSITIONING PINK XL (MISCELLANEOUS) ×2 IMPLANT
PLUG CATH AND CAP STRL 200 (CATHETERS) IMPLANT
RELOAD STAPLE 45 4.1 GRN THCK (STAPLE) ×2 IMPLANT
SCISSORS LAP 5X45 EPIX DISP (ENDOMECHANICALS) IMPLANT
SCISSORS MNPLR CVD DVNC XI (INSTRUMENTS) ×2 IMPLANT
SEAL UNIV 5-12 XI (MISCELLANEOUS) ×8 IMPLANT
SET TUBE SMOKE EVAC HIGH FLOW (TUBING) ×2 IMPLANT
SOL PREP POV-IOD 4OZ 10% (MISCELLANEOUS) ×2 IMPLANT
SOLUTION ELECTROSURG ANTI STCK (MISCELLANEOUS) ×2 IMPLANT
SPIKE FLUID TRANSFER (MISCELLANEOUS) ×2 IMPLANT
SUT ETHILON 3 0 PS 1 (SUTURE) ×2 IMPLANT
SUT MNCRL AB 4-0 PS2 18 (SUTURE) ×4 IMPLANT
SUT VIC AB 0 CT1 27XBRD ANTBC (SUTURE) ×2 IMPLANT
SUT VICRYL 0 UR6 27IN ABS (SUTURE) ×4 IMPLANT
SUTURE STRAT PDS 2-0 15 CT-2.5 (SUTURE) ×2 IMPLANT
SUTURE V-LC BRB 180 2/0GR6GS22 (SUTURE) ×2 IMPLANT
SUTURE VLOC BRB 180 ABS3/0GR12 (SUTURE) ×4 IMPLANT
WATER STERILE IRR 1000ML POUR (IV SOLUTION) ×2 IMPLANT

## 2023-11-14 NOTE — H&P (Signed)
 Prostate cancer   Patient was diagnosed with favorable intermediate risk, grade group 2, Gleason 3+4 = 7 prostate cancer.   Prostate MRI, 05/31/2023:  35 g prostate  PI-RADS 4 lesion bilateral anterior fibromuscular stroma in the mid gland and apex: Gleason 3+3 = 6 in 3/3 cores  Prostate biopsy, 07/10/2023: 6 cores Gleason 3+3 = 6, less than 20% in all cores. 3+4 = 7 (60%) right mid lateral   IPSS: 4.5, QOL 1  Normal erectile function   Patient is otherwise healthy, treated for mild asthma. No past surgical history.   Intc: Met with Dr. Patrcia regarding prostate radiation and is here today to discuss surgery further.   10/30/2023:  Robert Bray is here today for preop evaluation prior to having robotic radical prostatectomy with Dr. Cam on 11/14/2023. The patient will have his preop evaluation for anesthesia at [facility] on 11/01/2023. The patient denies any new shortness of breath or chest pain, medications or diagnoses. The patient denies being on a blood thinner. The patient's brother is with him here today and reports that the patient does drink some alcohol  nightly. We discussed cutting back and eventually stopping this prior to the surgery to avoid any complications at that time related to this issue. The patient denies any changes in his urination or blood in his urine, recent fevers or illnesses. Postop expectations were thoroughly discussed at his prior appointment however I believe he discussed this with the patient and his brother and gave them a handout with information regarding his postop progression.     ALLERGIES: oxycodone  Steroid Injections Steroid Meds    MEDICATIONS: [facility] Omeprazole  20 MG Tablet Delayed Release  Tamsulosin HCl 0.4 MG Capsule 1 capsule PO Daily  Albuterol  Sulfate  Amrix   Fluticasone  Propionate Diskus 250 MCG/ACT Aerosol Powder Breath Activated  Ibuprofen  CAPS Oral  Muscle Relaxor     GU PSH: Prostate Needle Biopsy - 07/10/2023       PSH Notes:  No Surgical Problems   NON-GU PSH: Surgical Pathology, Gross And Microscopic Examination For Prostate Needle - 07/10/2023 Visit Complexity (formerly GPC1X) - 08/03/2023, 07/16/2023, 06/19/2023, 03/26/2023     GU PMH: Prostate Cancer - 08/03/2023, - 07/16/2023 Elevated PSA - 07/10/2023, - 06/19/2023, - 03/26/2023, - 01/23/2023 Encounter for Prostate Cancer screening, Prostate cancer screening - 2014      PMH Notes:  1898-03-20 00:00:00 - Note: Normal Routine History And Physical Adult   NON-GU PMH: Personal history of other mental and behavioral disorders, History of depression - 2014 Arthritis GERD Gout    FAMILY HISTORY: copd - Runs in Family Death In The Family Mother - Runs In Family Hypertension - Mother nephrolithiasis - Brother renal failure - Mother Renal Transplant - Mother   SOCIAL HISTORY: Marital Status: Widowed Preferred Language: English; Ethnicity: Not Hispanic Or Latino; Race: Black or African American Current Smoking Status: Patient smokes. Has smoked since 01/19/2003. Smokes 1/2 pack per day.   Tobacco Use Assessment Completed: Used Tobacco in last 30 days? Drinks 3 drinks per day.  Does not drink caffeine. Patient's occupation is/was Retired.     Notes: Tobacco use, Marital History - Widowed, Occupation:, Caffeine Use, Alcohol  Use   REVIEW OF SYSTEMS:    GU Review Male:   Patient denies frequent urination, hard to postpone urination, burning/ pain with urination, get up at night to urinate, leakage of urine, stream starts and stops, trouble starting your stream, have to strain to urinate , erection problems, and penile pain.  Gastrointestinal (Upper):  Patient denies nausea, vomiting, and indigestion/ heartburn.  Gastrointestinal (Lower):   Patient denies diarrhea and constipation.  Constitutional:   Patient denies fever, night sweats, weight loss, and fatigue.  Skin:   Patient denies skin rash/ lesion and itching.  Eyes:   Patient denies blurred vision and double  vision.  Ears/ Nose/ Throat:   Patient denies sore throat and sinus problems.  Hematologic/Lymphatic:   Patient denies swollen glands and easy bruising.  Cardiovascular:   Patient denies leg swelling and chest pains.  Respiratory:   Patient denies cough and shortness of breath.  Endocrine:   Patient denies excessive thirst.  Musculoskeletal:   Patient denies back pain and joint pain.  Neurological:   Patient denies headaches and dizziness.  Psychologic:   Patient denies depression and anxiety.   VITAL SIGNS:      10/30/2023 02:50 PM  Weight 140 lb / 63.5 kg  Height 73 in / 185.42 cm  BP 109/67 mmHg  Pulse 97 /min  BMI 18.5 kg/m   GU PHYSICAL EXAMINATION:    Anus and Perineum: No hemorrhoids. No anal stenosis. No rectal fissure, no anal fissure. No edema, no dimple, no perineal tenderness, no anal tenderness.  Scrotum: No lesions. No edema. No cysts. No warts.  Epididymides: Right: no spermatocele, no masses, no cysts, no tenderness, no induration, no enlargement. Left: no spermatocele, no masses, no cysts, no tenderness, no induration, no enlargement.  Testes: No tenderness, no swelling, no enlargement left testes. No tenderness, no swelling, no enlargement right testes. Normal location left testes. Normal location right testes. No mass, no cyst, no varicocele, no hydrocele left testes. No mass, no cyst, no varicocele, no hydrocele right testes.  Urethral Meatus: Normal size. No lesion, no wart, no discharge, no polyp. Normal location.  Penis: Circumcised, no warts, no cracks. No dorsal Peyronie's plaques, no left corporal Peyronie's plaques, no right corporal Peyronie's plaques, no scarring, no warts. No balanitis, no meatal stenosis.  Prostate: 40 gram or 2+ size. Left lobe normal consistency, right lobe normal consistency. Symmetrical lobes. No prostate nodule. Left lobe no tenderness, right lobe no tenderness.  Seminal Vesicles: Nonpalpable.  Sphincter Tone: Normal sphincter. No  rectal tenderness. No rectal mass.    MULTI-SYSTEM PHYSICAL EXAMINATION:    Constitutional: Well-nourished. No physical deformities. Normally developed. Good grooming.  Neck: Neck symmetrical, not swollen. Normal tracheal position.  Respiratory: No labored breathing, no use of accessory muscles.   Cardiovascular: Normal temperature, normal extremity pulses, no swelling, no varicosities.  Lymphatic: No enlargement of neck, axillae, groin.  Skin: No paleness, no jaundice, no cyanosis. No lesion, no ulcer, no rash.  Neurologic / Psychiatric: Oriented to time, oriented to place, oriented to person. No depression, no anxiety, no agitation.  Gastrointestinal: No mass, no tenderness, no rigidity, non obese abdomen.  Eyes: Normal conjunctivae. Normal eyelids.  Ears, Nose, Mouth, and Throat: Left ear no scars, no lesions, no masses. Right ear no scars, no lesions, no masses. Nose no scars, no lesions, no masses. Normal hearing. Normal lips.  Musculoskeletal: Normal gait and station of head and neck.     Complexity of Data:  Source Of History:  Patient, Family/Caregiver, Medical Record Summary  Lab Test Review:   PSA  Records Review:   Previous Doctor Records, Previous Patient Records  Urine Test Review:   Urinalysis, Urine Culture   05/17/23 03/26/23 01/23/23 12/05/10  PSA  Total PSA 6.71 ng/mL 7.22 ng/mL 9.05 ng/mL 1.11     PROCEDURES:  Visit Complexity - G2211          Urinalysis - 81003 Dipstick Dipstick Cont'd  Color: Straw Bilirubin: Neg mg/dL  Appearance: Clear Ketones: Neg mg/dL  Specific Gravity: 8.984 Blood: Neg ery/uL  pH: 7.0 Protein: Neg mg/dL  Glucose: Neg mg/dL Urobilinogen: 0.2 mg/dL    Nitrites: Neg    Leukocyte Esterase: Neg leu/uL    ASSESSMENT:      ICD-10 Details  1 GU:   Prostate Cancer - C61 Chronic, Worsening   PLAN:           Document Letter(s):  Created for Patient: Clinical Summary         Notes:   The patient is doing well prior to his  procedure. All questions were answered to the best my ability. He will proceed to his preoperative evaluation at [facility] on 11/01/23 and then to his surgery on 11/14/2023. Urine culture pending.

## 2023-11-14 NOTE — Discharge Instructions (Signed)
Activity:  You are encouraged to ambulate frequently (about every hour during waking hours) to help prevent blood clots from forming in your legs or lungs.  However, you should not engage in any heavy lifting (> 10-15 lbs), strenuous activity, or straining. Diet: You should continue a clear liquid diet until passing gas from below.  Once this occurs, you may advance your diet to a soft diet that would be easy to digest (i.e soups, scrambled eggs, mashed potatoes, etc.) for 24 hours just as you would if getting over a bad stomach flu.  If tolerating this diet well for 24 hours, you may then begin eating regular food.  It will be normal to have some amount of bloating, nausea, and abdominal discomfort intermittently. Prescriptions:  You will be provided a prescription for pain medication to take as needed.  If your pain is not severe enough to require the prescription pain medication, you may take Tylenol instead.  You should also take an over the counter stool softener (Colace 100 mg twice daily) to avoid straining with bowel movements as the pain medication may constipate you. Finally, you will also be provided a prescription for an antibiotic to begin the day prior to your return visit in the office for catheter removal. Catheter care: You will be taught how to take care of the catheter by the nursing staff prior to discharge from the hospital.  You may use both a leg bag and the larger bedside bag but it is recommended to at least use the bigger bedside bag at nighttime as the leg bag is small and will fill up overnight and also does not drain as well when lying flat. You may periodically feel a strong urge to void with the catheter in place.  This is a bladder spasm and most often can occur when having a bowel movement or when you are moving around. It is typically self-limited and usually will stop after a few minutes.  You may use some Vaseline or Neosporin around the tip of the catheter to reduce friction  at the tip of the penis. Incisions: You may remove your dressing bandages the 2nd day after surgery.  You most likely will have a few small staples in each of the incisions and once the bandages are removed, the incisions may stay open to air.  You may start showering (not soaking or bathing in water) 48 hours after surgery and the incisions simply need to be patted dry after the shower.  No additional care is needed. What to call us about: You should call the office ([phone]) if you develop fever > 101, persistent vomiting, or the catheter stops draining. Also, feel free to call with any other questions you may have and remember the handout that was provided to you as a reference preoperatively which answers many of the common questions that arise after surgery. You may resume Mobic, aspirin, advil, aleve, vitamins, and supplements 7 days after surgery.  

## 2023-11-14 NOTE — Anesthesia Procedure Notes (Signed)
 Procedure Name: Intubation Date/Time: 11/14/2023 8:29 AM  Performed by: Gladis Honey, CRNAPre-anesthesia Checklist: Patient identified, Emergency Drugs available, Suction available and Patient being monitored Patient Re-evaluated:Patient Re-evaluated prior to induction Oxygen Delivery Method: Circle System Utilized Preoxygenation: Pre-oxygenation with 100% oxygen Induction Type: IV induction Ventilation: Mask ventilation without difficulty and Oral airway inserted - appropriate to patient size Laryngoscope Size: Cleotilde and 2 Grade View: Grade II Tube type: Oral Tube size: 7.5 mm Number of attempts: 2 Airway Equipment and Method: Stylet and Oral airway Placement Confirmation: ETT inserted through vocal cords under direct vision, positive ETCO2 and breath sounds checked- equal and bilateral Secured at: 24 cm Tube secured with: Tape Dental Injury: Injury to lip and Bloody posterior oropharynx  Comments: First attempt per paramedic student under direct supervision of MDA, unsuccessful, injury noted above. Intubated per CRNA without incident.

## 2023-11-14 NOTE — Interval H&P Note (Signed)
 History and Physical Interval Note:  11/14/2023 7:33 AM  Robert Bray  has presented today for surgery, with the diagnosis of PROSTATE CANCER.  The various methods of treatment have been discussed with the patient and family. After consideration of risks, benefits and other options for treatment, the patient has consented to  Procedure(s) with comments: PROSTATECTOMY, RADICAL, ROBOT-ASSISTED, LAPAROSCOPIC (N/A) - ROBOTIC RADICAL PROSTATECTOMY AND BILATERAL PELVIC LYMPH NODE DISSECTION LYMPHADENECTOMY, PELVIS, ROBOT-ASSISTED (Bilateral) as a surgical intervention.  The patient's history has been reviewed, patient examined, no change in status, stable for surgery.  I have reviewed the patient's chart and labs.  Questions were answered to the patient's satisfaction.     Morene LELON Salines

## 2023-11-14 NOTE — Op Note (Signed)
 Preoperative diagnosis:  Prostate Cancer   Postoperative diagnosis:  same   Procedure: Robotic assisted laparoscopic radical prostatectomy Bilateral pelvic lymph node dissection  Surgeon: Morene MICAEL Salines, MD First Assistant: Alan Hammonds, PA  Anesthesia: General  Complications: None  Intraoperative findings:  #1 - bilateral nerve spare #2 - large bladder neck - tight anastomosis   EBL: 100cc  Specimens:  #1.  Prostate and seminal vesicals #2.  Bilateral pelvic lymph nodes  Indication: Robert Bray is a 68 y.o. patient with prostate cancer.  After reviewing the management options for treatment, he elected to proceed with the removal of his prostate. We have discussed the potential benefits and risks of the procedure, side effects of the proposed treatment, the likelihood of the patient achieving the goals of the procedure, and any potential problems that might occur during the procedure or recuperation. Informed consent has been obtained.  Description of procedure:  The patient was consented in the preoperative holding area. He was in brought back to the operating room placed the table in supine position. General anesthesia was then induced and endotracheal tube was inserted. He was then placed in dorsolithotomy position and placed in steep Trendelenburg. He was then prepped and draped in the routine sterile fashion. We, the first assistant and I, then began by making a 10 mm incision supraumbilical midline incision the skin down through into the peritoneum. Then placed a 8 mm trocar. I then inflated the abdomen and inserted the 0 robotic lens. We then placed 2 additional a 8 millimeter trochars in the patient's left lower abdomen proximally 9 cm apart and 2 trochars on the patient's right lower abdomen, one was an 8 mm trocar and the one most lateral was a 12 mm trocar which was used as the assistant port. A 5 mm trocar was placed by triangulating the 2 right lateral ports  as a second assistant port. These ports were all placed under visual guidance. Once the ports were noted to be satisfactory position the robot was docked. We started with the 0 lens, monopolar scissors in the right hand and the [place]  forceps the left hand as well as a fenestrated grasper as the third arm on the left-hand side.   We, the first assistant and I,  began our dissection the posterior plane incising the peritoneum at the level of the vas deferens. Isolated the left vas deferens and dissected it proximally towards the spermatic cord for 5 cm prior to ligating it. Then used this as traction to isolate the left the seminal vesicle which was then undressed bluntly and completely dissected out, all vessels were cauterized with a combination of bipolar and the monopolar scissors. We then turned our attention to the right side and similarly dissected out the right vas deferens and seminal vesicle. Once the SVs had been freed, we turned our attention to the posterior plane and bluntly dissected the tissue between the rectum and the posterior wall of the prostate bluntly out towards the apex.    At this point the bladder was taken down starting at the urachal remnant with a combination of both blunt dissection and sharp dissection using monopolar cautery the bladder was dropped down in the usual fashion to the medial umbilical ligaments laterally and the dorsal vein of the prostate anteriorly creating our space of Retzius. We then turned our attention to the endopelvic fascia which was incised laterally starting on the patient's right-hand side the levator muscles were pushed off the prostate laterally up towards  the dorsal vein complex on the right-hand side. This process was then repeated on the left-hand side and a nice notch was created for the dorsal vein. I then used a 45mm stapler to staple the dorsal vein.   We,the first assistant and I, then located the bladder neck at the vesicoprostatic  junction and using the monopolar scissors dissected down through the perivesical tissues and the bladder neck down to the prostatic urethra. The catheter was then deflated and pulled through our urethral opening and then used to retract the prostate anteriorly for the posterior bladder neck dissection. Once through the bladder neck and into the posterior plane of the prostate, the SVs were brought through the opening. The left pedicle was then isolated and systematically ligated with Weck clips and scissors. The nerve bundle was then peeled off the posterior lateral aspect of the prostate and bluntly dissected away off the prostate.  This was then repeated on the right side.    I then came down through the dorsal venous complex anteriorly down to the membranous urethra using the monopolar. Once down to the urethra, it was transected sharply and the apex of the prostate was then dissected off the levator and rectourethralis muscles. Once the apex of the prostate had been dissected free we came back to the base of the prostate and bluntly push the rectum and nerve vascular bundle off the prostate the patient's left and used clips on the patient's right to free the prostate. Once the prostate was free it was placed off to the side. The pelvis was then irrigated with normal saline and noted to be relatively hemostatic.  Attention was then turned to the right pelvic sidewall. The fibrofatty tissue between the external iliac vein, confluence of the iliac vessels, hypogastric artery, and Cooper's ligament was dissected free from the pelvic sidewall with care to preserve the obturator nerve. Weck clips were used for lymphostasis and hemostasis. An identical procedure was performed on the contralateral side and the lymphatic packets were removed for permanent pathologic analysis.  The prostate and both lymph node tissues were placed in the Endo Catch bag and the string brought to the 5 mm port.    The vesicourethral  anastomosis was then completed with 2 interlocking 3-0 V. lock sutures running the anastomosis in the 6:00 position to the 12:00 position on each side and then tying it off on the top. The final catheter was then passed through the patient's urethra and into the bladder and 120 cc was instilled into the bladder to test the anastomosis. As there was no leak a 51 [place] Blake drain was passed through the left lateral port and placed around the vesicourethral anastomosis. A 12 mm assistant port on the right lateral side was then closed with 0 Vicryl with the help of the [employer] needle. The 12 mm midline infraumbilical incision was then extended another centimeter taken down and the fascia opened to remove the Endo Catch bag with the prostate specimen. The fascia was then closed with a 0 Vicryl and all skin ports were closed with 4-0 Monocryl in a subcutaneous fashion. Dermabond glue was then applied to the incisions. The drain was then secured to the skin with a 0 nylon stitch and dressing applied.   At the end of the case all laps needles and sponges had been accounted for. There no immediate complications. The patient returned to the PACU in stable condition.

## 2023-11-14 NOTE — Anesthesia Postprocedure Evaluation (Signed)
 Anesthesia Post Note  Patient: Robert Bray  Procedure(s) Performed: PROSTATECTOMY, RADICAL, ROBOT-ASSISTED, LAPAROSCOPIC (Abdomen) LYMPHADENECTOMY, PELVIS, ROBOT-ASSISTED (Bilateral)     Patient location during evaluation: PACU Anesthesia Type: General Level of consciousness: awake and alert Pain management: pain level controlled Vital Signs Assessment: post-procedure vital signs reviewed and stable Respiratory status: spontaneous breathing, nonlabored ventilation, respiratory function stable and patient connected to nasal cannula oxygen Cardiovascular status: blood pressure returned to baseline and stable Postop Assessment: no apparent nausea or vomiting Anesthetic complications: no   No notable events documented.  Last Vitals:  Vitals:   11/14/23 1350 11/14/23 1443  BP: 94/65 (!) 90/49  Pulse: 71 82  Resp: 19   Temp: 36.6 C   SpO2: 96% 100%    Last Pain:  Vitals:   11/14/23 1403  TempSrc:   PainSc: Asleep                 Garnette DELENA Gab

## 2023-11-14 NOTE — Plan of Care (Signed)
 The patient's pain is currently controlled, he has ambulated and is now sitting up in the chair.

## 2023-11-14 NOTE — Transfer of Care (Signed)
 Immediate Anesthesia Transfer of Care Note  Patient: Robert Bray  Procedure(s) Performed: PROSTATECTOMY, RADICAL, ROBOT-ASSISTED, LAPAROSCOPIC (Abdomen) LYMPHADENECTOMY, PELVIS, ROBOT-ASSISTED (Bilateral)  Patient Location: PACU  Anesthesia Type:General  Level of Consciousness: drowsy  Airway & Oxygen Therapy: Patient Spontanous Breathing and Patient connected to face mask oxygen  Post-op Assessment: Report given to RN and Post -op Vital signs reviewed and stable  Post vital signs: Reviewed and stable  Last Vitals:  Vitals Value Taken Time  BP 94/59 11/14/23 12:00  Temp 35.8 C 11/14/23 11:30  Pulse 63 11/14/23 12:02  Resp 11 11/14/23 12:02  SpO2 100 % 11/14/23 12:02  Vitals shown include unfiled device data.  Last Pain:  Vitals:   11/14/23 1115  TempSrc:   PainSc: 0-No pain      Patients Stated Pain Goal: 4 (11/14/23 0558)  Complications: No notable events documented.

## 2023-11-15 ENCOUNTER — Encounter (HOSPITAL_COMMUNITY): Payer: Self-pay | Admitting: Urology

## 2023-11-15 DIAGNOSIS — C61 Malignant neoplasm of prostate: Secondary | ICD-10-CM | POA: Diagnosis not present

## 2023-11-15 LAB — CREATININE, FLUID (PLEURAL, PERITONEAL, JP DRAINAGE): Creat, Fluid: 0.9 mg/dL

## 2023-11-15 LAB — BASIC METABOLIC PANEL WITH GFR
Anion gap: 10 (ref 5–15)
BUN: 11 mg/dL (ref 8–23)
CO2: 23 mmol/L (ref 22–32)
Calcium: 8.4 mg/dL — ABNORMAL LOW (ref 8.9–10.3)
Chloride: 102 mmol/L (ref 98–111)
Creatinine, Ser: 0.81 mg/dL (ref 0.61–1.24)
GFR, Estimated: >60 mL/min (ref >60)
Glucose, Bld: 95 mg/dL (ref 70–99)
Potassium: 4.4 mmol/L (ref 3.5–5.1)
Sodium: 135 mmol/L (ref 135–145)

## 2023-11-15 LAB — HEMOGLOBIN AND HEMATOCRIT, BLOOD
HCT: 30.4 % — ABNORMAL LOW (ref 39.0–52.0)
Hemoglobin: 9.3 g/dL — ABNORMAL LOW (ref 13.0–17.0)

## 2023-11-15 MED ORDER — ACETAMINOPHEN 10 MG/ML IV SOLN
1000.0000 mg | Freq: Four times a day (QID) | INTRAVENOUS | Status: AC
  Administered 2023-11-15 – 2023-11-16 (×4): 1000 mg via INTRAVENOUS
  Filled 2023-11-15 (×4): qty 100

## 2023-11-15 MED ORDER — CHLORHEXIDINE GLUCONATE CLOTH 2 % EX PADS
6.0000 | MEDICATED_PAD | Freq: Every day | CUTANEOUS | Status: DC
  Administered 2023-11-15 – 2023-11-16 (×2): 6 via TOPICAL

## 2023-11-15 NOTE — Progress Notes (Signed)
 Patient offered to ambulate in hall but patient complaining of abdominal pain. Dilaudid  1 mg given. Patient still refusing to ambulate after pain med given.

## 2023-11-15 NOTE — Progress Notes (Signed)
 Urology Inpatient Progress Report  Prostate cancer (HCC) [C61] Procedure(s): PROSTATECTOMY, RADICAL, ROBOT-ASSISTED, LAPAROSCOPIC LYMPHADENECTOMY, PELVIS, ROBOT-ASSISTED 1 Day Post-Op  Intv/Subj: No acute events overnight. Patient is without complaint. Right leg pain yesterday - resolved. Walked yesterday BP low, but not complaining of lightheadedness   Principal Problem:   Prostate cancer (HCC)  Current Facility-Administered Medications  Medication Dose Route Frequency Provider Last Rate Last Admin   0.45 % sodium chloride  infusion   Intravenous Continuous Cory Palma, PA-C 75 mL/hr at 11/15/23 0324 Infusion Verify at 11/15/23 0324   acetaminophen  (OFIRMEV ) IV 1,000 mg  1,000 mg Intravenous Q6H Cory Palma, PA-C   Stopped at 11/15/23 0305   albuterol  (PROVENTIL ) (2.5 MG/3ML) 0.083% nebulizer solution 2.5 mg  2.5 mg Nebulization Q4H PRN Cam Morene ORN, MD       allopurinol  (ZYLOPRIM ) tablet 300 mg  300 mg Oral Daily Cory Palma, PA-C   300 mg at 11/14/23 1515   diphenhydrAMINE  (BENADRYL ) injection 12.5 mg  12.5 mg Intravenous Q6H PRN Cory Palma, PA-C       Or   diphenhydrAMINE  (BENADRYL ) 12.5 MG/5ML elixir 12.5 mg  12.5 mg Oral Q6H PRN Cory Palma, PA-C       docusate sodium  (COLACE) capsule 100 mg  100 mg Oral BID Cory Palma, PA-C   100 mg at 11/14/23 2126   feeding supplement (BOOST / RESOURCE BREEZE) liquid 1 Container  1 Container Oral TID BM Cam Morene ORN, MD   1 Container at 11/14/23 2038   fluticasone  furoate-vilanterol (BREO ELLIPTA ) 200-25 MCG/ACT 1 puff  1 puff Inhalation Daily Cam Morene ORN, MD       HYDROmorphone  (DILAUDID ) injection 0.5-1 mg  0.5-1 mg Intravenous Q2H PRN Cory Palma, PA-C       hyoscyamine  (LEVSIN  SL) SL tablet 0.125-0.25 mg  0.125-0.25 mg Sublingual Q4H PRN Cory Palma, PA-C       ketorolac  (TORADOL ) 15 MG/ML injection 15 mg  15 mg Intravenous Q6H Dancy, Amanda, PA-C   15 mg at 11/15/23 0246   magic mouthwash  5  mL Oral TID Cam Morene ORN, MD   5 mL at 11/14/23 2126   menthol -cetylpyridinium (CEPACOL) lozenge 3 mg  1 lozenge Oral PRN Cam Morene ORN, MD       methocarbamol  (ROBAXIN ) tablet 500 mg  500 mg Oral Daily Cory Palma, PA-C   500 mg at 11/14/23 1515   neomycin-bacitracin-polymyxin [phone] OINT 1 Application  1 Application Topical TID PRN Cory Palma, PA-C       ondansetron  (ZOFRAN ) injection 4 mg  4 mg Intravenous Q4H PRN Dancy, Amanda, PA-C       pantoprazole  (PROTONIX ) EC tablet 40 mg  40 mg Oral Daily Cory Palma, PA-C   40 mg at 11/14/23 1515   phenol (CHLORASEPTIC) mouth spray 1 spray  1 spray Mouth/Throat PRN Cam Morene ORN, MD       spiritus frumenti (ethyl alcohol ) solution 1 each  1 each Oral TID with meals Cory Palma, PA-C   1 each at 11/14/23 1831   traMADol  (ULTRAM ) tablet 50-100 mg  50-100 mg Oral Q6H PRN Cory Palma, PA-C         Objective: Vital: Vitals:   11/14/23 2211 11/15/23 0101 11/15/23 0527 11/15/23 0608  BP: (!) 87/59 (!) 95/58 (!) 84/56 (!) 94/56  Pulse: 80 73 72 68  Resp: 16 16 16    Temp: 97.7 F (36.5 C) 98.1 F (36.7 C) 98.2 F (36.8 C)   TempSrc: Oral Oral Oral  SpO2: 97% 93% 93%   Weight:      Height:       I/Os: I/O last 3 completed shifts: In: 4849.6 [I.V.:2747.4; IV Piggyback:2102.2] Out: 1330 [Urine:875; Drains:305; Blood:150]  Physical Exam:  General: Patient is in no apparent distress Lungs: Normal respiratory effort, chest expands symmetrically. GI: Incisions are c/d/i.  The abdomen is soft and nontender without mass. JP drain with serosanguinous drainage Foley: stray colored/brown  Ext: lower extremities symmetric  Lab Results: Recent Labs    11/14/23 1158 11/15/23 0344  HGB 10.3* 9.3*  HCT 33.4* 30.4*   Recent Labs    11/15/23 0344  NA 135  K 4.4  CL 102  CO2 23  GLUCOSE 95  BUN 11  CREATININE 0.81  CALCIUM  8.4*   No results for input(s): LABPT, INR in the last 72 hours. No results  for input(s): LABURIN in the last 72 hours. Results for orders placed or performed during the hospital encounter of 11/01/23  Urine Culture     Status: None   Collection Time: 11/01/23 12:06 PM   Specimen: Urine, Clean Catch  Result Value Ref Range Status   Specimen Description   Final    URINE, CLEAN CATCH Performed at [facility], [address] [place], [place] [address]    Special Requests   Final    NONE Performed at [facility], [address], [place], [place] [address]    Culture   Final    NO GROWTH Performed at [facility], [address], [place], [place] [address]    Report Status 11/02/2023 FINAL  Final    Studies/Results: No results found.  Assessment: Procedure(s): PROSTATECTOMY, RADICAL, ROBOT-ASSISTED, LAPAROSCOPIC LYMPHADENECTOMY, PELVIS, ROBOT-ASSISTED, 1 Day Post-Op  doing well.  Plan: Sending JP creatinine prior to removing it Encouraged ambulation D/c toradol  due to hgb drifting down Continue beer with meals Hopefully we can discharge patient at lunchtime.   Morene Salines, MD Urology 11/15/2023, 7:44 AM

## 2023-11-15 NOTE — Care Management Obs Status (Signed)
 MEDICARE OBSERVATION STATUS NOTIFICATION   Patient Details  Name: KAVI ALMQUIST MRN: [id number] Date of Birth: 13-Aug-1955   Medicare Observation Status Notification Given:  Yes    Duwaine GORMAN Aran, LCSW 11/15/2023, 10:31 AM

## 2023-11-15 NOTE — Progress Notes (Signed)
   11/15/23 0951  TOC Brief Assessment  Insurance and Status Reviewed  Patient has primary care physician No  Home environment has been reviewed Resides alone in single family home  Prior level of function: Independent with ADLs at baseline  Prior/Current Home Services No current home services  Social Drivers of Health Review SDOH reviewed no interventions necessary  Readmission risk has been reviewed Yes  Transition of care needs no transition of care needs at this time

## 2023-11-16 DIAGNOSIS — C61 Malignant neoplasm of prostate: Secondary | ICD-10-CM | POA: Diagnosis not present

## 2023-11-16 NOTE — TOC Transition Note (Signed)
 Transition of Care [facility]) - Discharge Note  Patient Details  Name: Robert Bray MRN: [id number] Date of Birth: 1955-09-16  Transition of Care [facility]) CM/SW Contact:  Duwaine GORMAN Aran, LCSW Phone Number: 11/16/2023, 9:20 AM  Clinical Narrative: CSW notified by urologist patient will need a rolling walker. Patient agreeable to rolling walker and confirmed he has not received one through insurance in the past 5 years. Patient agreeable to DME referral to [employer]. CSW made rolling walker referral to [facility] with [employer]. [employer] to deliver walker to room. Treatment team updated. Care management signing off.  Final next level of care: Home/Self Care Barriers to Discharge: No Barriers Identified  Patient Goals and CMS Choice Patient states their goals for this hospitalization and ongoing recovery are:: Get rolling walker CMS Medicare.gov Compare Post Acute Care list provided to:: Patient Choice offered to / list presented to : Patient  Discharge Plan and Services Additional resources added to the After Visit Summary for         DME Arranged: Walker rolling DME Agency: [employer] Date DME Agency Contacted: 11/16/23 Time DME Agency Contacted: [phone] Representative spoke with at DME Agency: Thomasina  Social Drivers of Health (SDOH) Interventions SDOH Screenings   Food Insecurity: Food Insecurity Present (11/14/2023)  Housing: Low Risk  (11/14/2023)  Transportation Needs: Unmet Transportation Needs (11/14/2023)  Utilities: Not At Risk (11/14/2023)  Social Connections: Moderately Integrated (11/14/2023)  Tobacco Use: High Risk (11/14/2023)   Readmission Risk Interventions     No data to display

## 2023-11-16 NOTE — Progress Notes (Signed)
 Called patient's sister, Adrien, per patient request and gave her all the discharge information over the phone. All questions answered. Pt given instructions as well and leg beg attached to foley catheter.

## 2023-11-16 NOTE — Discharge Summary (Signed)
 Date of admission: 11/14/2023  Date of discharge: 11/16/2023  Admission diagnosis: prostate cancer  Discharge diagnosis: same  Secondary diagnoses:  Patient Active Problem List   Diagnosis Date Noted   Prostate cancer (HCC) 11/14/2023   Muscle spasm of back 11/18/2016   Need for hepatitis A and B vaccination 08/02/2016   Seasonal allergic rhinitis 08/02/2016   Chronic Helicobacter pylori gastritis 05/01/2016   Normocytic anemia 02/04/2016   Achilles tendon injury 02/04/2016   Cirrhosis of liver (HCC) 06/28/2015   Monoallelic mutation of HFE gene 92/79/7983   Syncope 10/02/2014   Varix of lower extremity 04/30/2013   Meralgia paraesthetica 04/30/2013   Bilateral hip joint arthritis 10/24/2012   Alcohol  use disorder, mild, abuse 10/24/2012   History of cocaine abuse (HCC) 10/24/2012   Tobacco abuse 10/24/2012   Health care maintenance 10/24/2012    Procedures performed: Procedure(s): PROSTATECTOMY, RADICAL, ROBOT-ASSISTED, LAPAROSCOPIC LYMPHADENECTOMY, PELVIS, ROBOT-ASSISTED  History and Physical: For full details, please see admission history and physical. Briefly, Robert Bray is a 68 y.o. year old patient with history of prostate cancer.   Hospital Course: Patient tolerated the procedure well.  He was then transferred to the floor after an uneventful PACU stay.  His hospital course was uncomplicated.  On POD#2 he had met discharge criteria: was eating a regular diet, was up and ambulating independently,  pain was well controlled, and was ready to for discharge. Due to his history of right hip dislocation and arthritis we did send him home with a front wheel walker.  He did spend an extra day because he had some low blood pressures, but but these resolved.  His vitals were otherwise stable.  Physical exam on date of discharge  NAD Vitals:   11/15/23 1625 11/15/23 1628 11/15/23 2042 11/16/23 0446  BP: (!) 93/59 (!) 105/57 108/64 104/69  Pulse: 75 75 77 84  Resp:   20  16  Temp:   98.7 F (37.1 C) 98.4 F (36.9 C)  TempSrc:   Oral Oral  SpO2: 94% 94% 90% 94%  Weight:      Height:        Intake/Output Summary (Last 24 hours) at 11/16/2023 1126 Last data filed at 11/16/2023 0830 Gross per 24 hour  Intake 450 ml  Output 2770 ml  Net -2320 ml   Nonlabored breathing, on room air Abdomen is slightly distended, appropriately tender, drain site was dressed and padded, the drain had been removed. Extremities are symmetric with no edema or abnormalities. Foley catheter is draining straw-colored urine    Laboratory values:  Recent Labs    11/14/23 1158 11/15/23 0344  HGB 10.3* 9.3*  HCT 33.4* 30.4*   Recent Labs    11/15/23 0344  NA 135  K 4.4  CL 102  CO2 23  GLUCOSE 95  BUN 11  CREATININE 0.81  CALCIUM  8.4*   No results for input(s): LABPT, INR in the last 72 hours. No results for input(s): LABURIN in the last 72 hours. Results for orders placed or performed during the hospital encounter of 11/01/23  Urine Culture     Status: None   Collection Time: 11/01/23 12:06 PM   Specimen: Urine, Clean Catch  Result Value Ref Range Status   Specimen Description   Final    URINE, CLEAN CATCH Performed at [facility], [address] [place], [place] [address]    Special Requests   Final    NONE Performed at [facility], [address]  [place], [place] [address]    Culture   Final    NO GROWTH Performed at [facility], [address], [place], [place] [address]    Report Status 11/02/2023 FINAL  Final    Disposition: Home  Discharge instruction: The patient was instructed to be ambulatory but told to refrain from heavy lifting, strenuous activity, or driving.   Discharge medications:  Allergies as of 11/16/2023       Reactions   Oxycodone  Hcl Itching        Medication List     STOP taking these medications    meloxicam  7.5 MG tablet Commonly known as: MOBIC         TAKE these medications    acetaminophen  650 MG CR tablet Commonly known as: TYLENOL  Take 650 mg by mouth every 8 (eight) hours as needed for pain.   albuterol  108 (90 Base) MCG/ACT inhaler Commonly known as: VENTOLIN  HFA Inhale 2 puffs into the lungs every 4 (four) hours as needed for wheezing or shortness of breath.   allopurinol  300 MG tablet Commonly known as: ZYLOPRIM  Take 300 mg by mouth daily.   docusate sodium  100 MG capsule Commonly known as: COLACE Take 1 capsule (100 mg total) by mouth 2 (two) times daily.   methocarbamol  500 MG tablet Commonly known as: ROBAXIN  Take 500 mg by mouth daily.   omeprazole  20 MG capsule Commonly known as: PRILOSEC Take 1 capsule (20 mg total) by mouth daily.   sulfamethoxazole -trimethoprim  800-160 MG tablet Commonly known as: BACTRIM  DS Take 1 tablet by mouth 2 (two) times daily. Start the day prior to foley removal appointment   traMADol  50 MG tablet Commonly known as: Ultram  Take 1-2 tablets (50-100 mg total) by mouth every 6 (six) hours as needed for moderate pain (pain score 4-6) or severe pain (pain score 7-10).   Wixela Inhub 250-50 MCG/ACT Aepb Generic drug: fluticasone -salmeterol Inhale 1 puff into the lungs in the morning and at bedtime.               Durable Medical Equipment  (From admission, onward)           Start     Ordered   11/15/23 1220  For home use only DME Walker rolling  Once       Question Answer Comment  Walker: With 5 Inch Wheels   Patient needs a walker to treat with the following condition Prostate cancer (HCC)      11/15/23 1220            Followup:   Follow-up Information     Buck Rodena BIRCH, NP Follow up on 11/21/2023.   Specialty: Urology Why: at 9:45 Contact information: [address] [place] [place] [address] [phone]

## 2023-11-20 LAB — SURGICAL PATHOLOGY
